# Patient Record
Sex: Male | Born: 1960 | Race: Black or African American | Hispanic: No | Smoking: Never smoker
Health system: Southern US, Community
[De-identification: ages and names within clinical notes are randomized; demographics above are authoritative.]

## PROBLEM LIST (undated history)

## (undated) DIAGNOSIS — I10 Essential (primary) hypertension: Secondary | ICD-10-CM

## (undated) HISTORY — PX: ORTHOPEDIC SURGERY: SHX850

---

## 2000-05-22 ENCOUNTER — Encounter: Payer: Self-pay | Admitting: Emergency Medicine

## 2000-05-22 ENCOUNTER — Emergency Department (HOSPITAL_COMMUNITY): Admission: EM | Admit: 2000-05-22 | Discharge: 2000-05-22 | Payer: Self-pay | Admitting: Emergency Medicine

## 2000-06-05 ENCOUNTER — Encounter: Payer: Self-pay | Admitting: Preventative Medicine

## 2000-06-05 ENCOUNTER — Ambulatory Visit (HOSPITAL_COMMUNITY): Admission: RE | Admit: 2000-06-05 | Discharge: 2000-06-05 | Payer: Self-pay | Admitting: Preventative Medicine

## 2003-10-11 ENCOUNTER — Emergency Department (HOSPITAL_COMMUNITY): Admission: EM | Admit: 2003-10-11 | Discharge: 2003-10-12 | Payer: Self-pay | Admitting: Emergency Medicine

## 2004-01-06 ENCOUNTER — Emergency Department (HOSPITAL_COMMUNITY): Admission: EM | Admit: 2004-01-06 | Discharge: 2004-01-06 | Payer: Self-pay | Admitting: Emergency Medicine

## 2004-07-22 ENCOUNTER — Emergency Department (HOSPITAL_COMMUNITY): Admission: EM | Admit: 2004-07-22 | Discharge: 2004-07-22 | Payer: Self-pay | Admitting: Emergency Medicine

## 2004-12-29 ENCOUNTER — Emergency Department (HOSPITAL_COMMUNITY): Admission: EM | Admit: 2004-12-29 | Discharge: 2004-12-29 | Payer: Self-pay | Admitting: Emergency Medicine

## 2005-01-18 ENCOUNTER — Emergency Department (HOSPITAL_COMMUNITY): Admission: EM | Admit: 2005-01-18 | Discharge: 2005-01-18 | Payer: Self-pay | Admitting: Emergency Medicine

## 2005-06-03 ENCOUNTER — Emergency Department (HOSPITAL_COMMUNITY): Admission: EM | Admit: 2005-06-03 | Discharge: 2005-06-03 | Payer: Self-pay | Admitting: Emergency Medicine

## 2005-06-18 ENCOUNTER — Emergency Department (HOSPITAL_COMMUNITY): Admission: EM | Admit: 2005-06-18 | Discharge: 2005-06-18 | Payer: Self-pay | Admitting: Emergency Medicine

## 2007-05-26 ENCOUNTER — Emergency Department (HOSPITAL_COMMUNITY): Admission: EM | Admit: 2007-05-26 | Discharge: 2007-05-26 | Payer: Self-pay | Admitting: Emergency Medicine

## 2008-10-21 ENCOUNTER — Emergency Department (HOSPITAL_COMMUNITY): Admission: EM | Admit: 2008-10-21 | Discharge: 2008-10-21 | Payer: Self-pay | Admitting: Emergency Medicine

## 2009-05-02 ENCOUNTER — Ambulatory Visit: Payer: Self-pay | Admitting: Family Medicine

## 2009-05-02 DIAGNOSIS — R5383 Other fatigue: Secondary | ICD-10-CM

## 2009-05-02 DIAGNOSIS — R030 Elevated blood-pressure reading, without diagnosis of hypertension: Secondary | ICD-10-CM

## 2009-05-02 DIAGNOSIS — R5381 Other malaise: Secondary | ICD-10-CM | POA: Insufficient documentation

## 2009-05-06 ENCOUNTER — Encounter: Payer: Self-pay | Admitting: Physician Assistant

## 2009-05-06 LAB — CONVERTED CEMR LAB
ALT: 42 units/L (ref 0–53)
AST: 22 units/L (ref 0–37)
Albumin: 4.4 g/dL (ref 3.5–5.2)
Alkaline Phosphatase: 87 units/L (ref 39–117)
BUN: 10 mg/dL (ref 6–23)
CO2: 22 meq/L (ref 19–32)
Chloride: 105 meq/L (ref 96–112)
Creatinine, Ser: 1.01 mg/dL (ref 0.40–1.50)
HCT: 41.5 % (ref 39.0–52.0)
MCHC: 31.6 g/dL (ref 30.0–36.0)
MCV: 92 fL (ref 78.0–100.0)
Platelets: 327 10*3/uL (ref 150–400)
RBC: 4.51 M/uL (ref 4.22–5.81)
RDW: 13.3 % (ref 11.5–15.5)
Sodium: 138 meq/L (ref 135–145)
TSH: 2.739 microintl units/mL (ref 0.350–4.500)
Total Protein: 7 g/dL (ref 6.0–8.3)
VLDL: 25 mg/dL (ref 0–40)
Vit D, 25-Hydroxy: 14 ng/mL — ABNORMAL LOW (ref 30–89)
WBC: 5.6 10*3/uL (ref 4.0–10.5)

## 2009-06-20 ENCOUNTER — Emergency Department (HOSPITAL_COMMUNITY): Admission: EM | Admit: 2009-06-20 | Discharge: 2009-06-20 | Payer: Self-pay | Admitting: Emergency Medicine

## 2009-08-19 ENCOUNTER — Emergency Department (HOSPITAL_COMMUNITY): Admission: EM | Admit: 2009-08-19 | Discharge: 2009-08-20 | Payer: Self-pay | Admitting: Emergency Medicine

## 2010-02-24 NOTE — Letter (Signed)
Summary: Letter  Letter   Imported By: Lind Guest 05/07/2009 15:39:09  _____________________________________________________________________  External Attachment:    Type:   Image     Comment:   External Document
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
## 2010-04-11 LAB — CBC
Hemoglobin: 12.1 g/dL — ABNORMAL LOW (ref 13.0–17.0)
MCH: 29.6 pg (ref 26.0–34.0)
MCHC: 33.5 g/dL (ref 30.0–36.0)
MCV: 88.3 fL (ref 78.0–100.0)
RDW: 13.8 % (ref 11.5–15.5)

## 2010-04-11 LAB — BASIC METABOLIC PANEL
BUN: 14 mg/dL (ref 6–23)
GFR calc Af Amer: >60 mL/min (ref >60)
Potassium: 3.9 mEq/L (ref 3.5–5.1)
Sodium: 139 mEq/L (ref 135–145)

## 2010-04-11 LAB — POCT CARDIAC MARKERS
Myoglobin, poc: 52.6 ng/mL (ref 12–200)
Troponin i, poc: <0.05 ng/mL (ref 0.00–0.09)

## 2010-04-11 LAB — DIFFERENTIAL
Basophils Absolute: 0 10*3/uL (ref 0.0–0.1)
Basophils Relative: 1 % (ref 0–1)
Eosinophils Absolute: 0.4 10*3/uL (ref 0.0–0.7)
Eosinophils Relative: 7 % — ABNORMAL HIGH (ref 0–5)
Lymphocytes Relative: 42 % (ref 12–46)
Lymphs Abs: 2.5 10*3/uL (ref 0.7–4.0)
Monocytes Absolute: 0.6 10*3/uL (ref 0.1–1.0)
Neutrophils Relative %: 40 % — ABNORMAL LOW (ref 43–77)

## 2010-04-11 LAB — D-DIMER, QUANTITATIVE: D-Dimer, Quant: 0.43 ug/mL-FEU (ref 0.00–0.48)

## 2010-12-02 ENCOUNTER — Emergency Department (HOSPITAL_COMMUNITY)
Admission: EM | Admit: 2010-12-02 | Discharge: 2010-12-03 | Disposition: A | Discharge status: Home / Self Care | Payer: BC Managed Care – PPO | Attending: Emergency Medicine | Admitting: Emergency Medicine

## 2010-12-02 ENCOUNTER — Encounter: Payer: Self-pay | Admitting: Emergency Medicine

## 2010-12-02 DIAGNOSIS — K2970 Gastritis, unspecified, without bleeding: Secondary | ICD-10-CM

## 2010-12-02 DIAGNOSIS — R109 Unspecified abdominal pain: Secondary | ICD-10-CM | POA: Insufficient documentation

## 2010-12-02 NOTE — ED Notes (Signed)
Patient c/o sharp abdominal pain all over; also c/o epigastric pain.  Patient states pain did not move and does not radiate.  States has had some nausea.

## 2010-12-03 ENCOUNTER — Emergency Department (HOSPITAL_COMMUNITY): Payer: BC Managed Care – PPO

## 2010-12-03 LAB — DIFFERENTIAL
Basophils Relative: 1 % (ref 0–1)
Lymphocytes Relative: 36 % (ref 12–46)
Monocytes Absolute: 0.4 10*3/uL (ref 0.1–1.0)
Monocytes Relative: 6 % (ref 3–12)
Neutro Abs: 3.5 10*3/uL (ref 1.7–7.7)
Neutrophils Relative %: 53 % (ref 43–77)

## 2010-12-03 LAB — OCCULT BLOOD, POC DEVICE: Fecal Occult Bld: NEGATIVE

## 2010-12-03 LAB — URINALYSIS, ROUTINE W REFLEX MICROSCOPIC
Glucose, UA: NEGATIVE mg/dL
Nitrite: NEGATIVE
Specific Gravity, Urine: 1.025 (ref 1.005–1.030)
Urobilinogen, UA: 0.2 mg/dL (ref 0.0–1.0)
pH: 6 (ref 5.0–8.0)

## 2010-12-03 LAB — COMPREHENSIVE METABOLIC PANEL
AST: 23 U/L (ref 0–37)
Alkaline Phosphatase: 80 U/L (ref 39–117)
BUN: 13 mg/dL (ref 6–23)

## 2010-12-03 LAB — CBC
HCT: 38.4 % — ABNORMAL LOW (ref 39.0–52.0)
Hemoglobin: 12.9 g/dL — ABNORMAL LOW (ref 13.0–17.0)
MCHC: 33.6 g/dL (ref 30.0–36.0)
MCV: 89.9 fL (ref 78.0–100.0)

## 2010-12-03 LAB — LIPASE, BLOOD: Lipase: 16 U/L (ref 11–59)

## 2010-12-03 LAB — POCT I-STAT TROPONIN I: Troponin i, poc: 0 ng/mL (ref 0.00–0.08)

## 2010-12-03 MED ORDER — ONDANSETRON HCL 4 MG/2ML IJ SOLN
4.0000 mg | Freq: Once | INTRAMUSCULAR | Status: AC
  Administered 2010-12-03: 4 mg via INTRAVENOUS
  Filled 2010-12-03: qty 2

## 2010-12-03 MED ORDER — FAMOTIDINE 20 MG PO TABS: 20.0000 mg | ORAL_TABLET | Freq: Two times a day (BID) | ORAL | Status: DC

## 2010-12-03 MED ORDER — FAMOTIDINE IN NACL 20-0.9 MG/50ML-% IV SOLN
20.0000 mg | Freq: Once | INTRAVENOUS | Status: AC
  Administered 2010-12-03: 20 mg via INTRAVENOUS
  Filled 2010-12-03: qty 50

## 2010-12-03 MED ORDER — HYDROCODONE-ACETAMINOPHEN 5-500 MG PO TABS: 1.0000 | ORAL_TABLET | Freq: Four times a day (QID) | ORAL | Status: AC | PRN

## 2010-12-03 MED ORDER — SODIUM CHLORIDE 0.9 % IV SOLN
Freq: Once | INTRAVENOUS | Status: AC
  Administered 2010-12-03: via INTRAVENOUS

## 2010-12-03 MED ORDER — MORPHINE SULFATE 4 MG/ML IJ SOLN
4.0000 mg | Freq: Once | INTRAMUSCULAR | Status: AC
  Administered 2010-12-03: 4 mg via INTRAVENOUS
  Filled 2010-12-03: qty 1

## 2010-12-03 MED ORDER — OMEPRAZOLE 20 MG PO CPDR: 20.0000 mg | DELAYED_RELEASE_CAPSULE | Freq: Every day | ORAL | Status: DC

## 2010-12-03 NOTE — ED Notes (Signed)
Tyler Chang notifeied of low bp

## 2010-12-03 NOTE — ED Notes (Signed)
Dr Brooke Dare at side to place central line

## 2010-12-03 NOTE — ED Provider Notes (Signed)
History     CSN: [id number] Arrival date & time: 12/02/2010 11:30 PM   First MD Initiated Contact with Patient 12/02/10 2343      Chief Complaint  Patient presents with  . Abdominal Pain    (Consider location/radiation/quality/duration/timing/severity/associated sxs/prior treatment) Patient is a 50 y.o. male presenting with abdominal pain. The history is provided by the patient.  Abdominal Pain The primary symptoms of the illness include abdominal pain, nausea and hematochezia. The primary symptoms of the illness do not include fever, fatigue, shortness of breath, vomiting, diarrhea or dysuria. The current episode started yesterday. The onset of the illness was gradual. The problem has been gradually worsening.  The abdominal pain began yesterday. The pain came on gradually. The abdominal pain has been gradually worsening since its onset. The abdominal pain is generalized. The abdominal pain does not radiate. The abdominal pain is relieved by nothing. The abdominal pain is exacerbated by movement and certain positions.  The patient has not had a change in bowel habit. Symptoms associated with the illness do not include chills, anorexia, constipation, urgency, hematuria, frequency or back pain.    Past Medical History  Diagnosis Date  . Asthma     Past Surgical History  Procedure Date  . Orthopedic surgery     No family history on file.  History  Substance Use Topics  . Smoking status: Never Smoker   . Smokeless tobacco: Never Used  . Alcohol Use: Yes     occ      Review of Systems  Constitutional: Negative for fever, chills, activity change, appetite change and fatigue.  HENT: Negative for congestion, sore throat, rhinorrhea, neck pain and neck stiffness.   Respiratory: Negative for cough and shortness of breath.   Cardiovascular: Negative for chest pain and palpitations.  Gastrointestinal: Positive for nausea, abdominal pain and hematochezia. Negative for vomiting,  diarrhea, constipation and anorexia.  Genitourinary: Negative for dysuria, urgency, frequency, hematuria and flank pain.  Musculoskeletal: Negative for back pain.  Neurological: Negative for dizziness, weakness, light-headedness and headaches.  All other systems reviewed and are negative.    Allergies  Review of patient's allergies indicates no known allergies.  Home Medications   Current Outpatient Rx  Name Route Sig Dispense Refill  . FAMOTIDINE 20 MG PO TABS Oral Take 1 tablet (20 mg total) by mouth 2 (two) times daily. 30 tablet 0  . HYDROCODONE-ACETAMINOPHEN 5-500 MG PO TABS Oral Take 1-2 tablets by mouth every 6 (six) hours as needed for pain. 10 tablet 0  . OMEPRAZOLE 20 MG PO CPDR Oral Take 1 capsule (20 mg total) by mouth daily. 30 capsule 0    BP 88/58  Pulse 116  Temp(Src) 98.3 F (36.8 C) (Oral)  Resp 20  Ht 5\' 4"  (1.626 m)  Wt 140 lb (63.504 kg)  BMI 24.03 kg/m2  SpO2 100%  Physical Exam  Nursing note and vitals reviewed. Constitutional: He is oriented to person, place, and time. He appears well-developed and well-nourished. No distress.  HENT:  Head: Normocephalic and atraumatic.  Mouth/Throat: Oropharynx is clear and moist. No oropharyngeal exudate.  Eyes: Conjunctivae and EOM are normal. Pupils are equal, round, and reactive to light.  Neck: Normal range of motion. Neck supple.  Cardiovascular: Normal rate, regular rhythm, normal heart sounds and intact distal pulses.   Pulmonary/Chest: Effort normal and breath sounds normal. No respiratory distress.  Abdominal: Soft. Bowel sounds are normal. There is tenderness (epigastric). There is no rebound and no guarding.  Genitourinary: Rectum  normal. Guaiac negative stool.  Musculoskeletal: Normal range of motion. He exhibits no tenderness.  Neurological: He is alert and oriented to person, place, and time.  Skin: Skin is warm and dry. No rash noted.    ED Course  Procedures (including critical care  time)  Labs Reviewed  CBC - Abnormal; Notable for the following:    Hemoglobin 12.9 (*)    HCT 38.4 (*)    All other components within normal limits  COMPREHENSIVE METABOLIC PANEL - Abnormal; Notable for the following:    GFR calc non Af Amer 81 (*)    All other components within normal limits  DIFFERENTIAL  LIPASE, BLOOD  URINALYSIS, ROUTINE W REFLEX MICROSCOPIC  POCT I-STAT TROPONIN I   Dg Abd Acute W/chest  12/03/2010  *RADIOLOGY REPORT*  Clinical Data: Sharp abdominal pain.  ACUTE ABDOMEN SERIES (ABDOMEN 2 VIEW & CHEST 1 VIEW)  Comparison: 08/19/2009  Findings:  Calcified 7 mm right lower lung nodule is unchanged. No focal consolidation.  Cardiomediastinal contours are within normal limits.  Mild eventration right hemidiaphragm.  Nonobstructive bowel gas pattern.  No free intraperitoneal air. Organ outlines normal where seen.  No acute osseous abnormality. Heterotopic osseous changes of the right hip with partially imaged intramedullary rod and intertrochanteric screw.  There is mild lucency around the hardware, which is nonspecific but could be evaluated with dedicated imaging if clinically warranted.  IMPRESSION: Nonobstructive bowel gas pattern.  Original Report Authenticated By: Waneta Martins, M.D.     1. Gastritis       MDM  Acute abdominal series and blood work was unremarkable. No symptoms are consistent with gastritis. He was treated with IV fluids, pain medication, antiemetics, Pepcid. He had improvement of his symptoms. Rectal examination was negative for blood. He was discharged home with H2-blocker, PPI, pain medication and instructions to followup with primary care physician. I'm not concerned about a surgical cause of his abdominal pain or more malignant cause of his abdominal pain.        [name], MD 12/03/10 [phone]

## 2012-05-24 ENCOUNTER — Emergency Department (HOSPITAL_COMMUNITY)
Admission: EM | Admit: 2012-05-24 | Discharge: 2012-05-24 | Disposition: A | Discharge status: Home / Self Care | Payer: BC Managed Care – PPO | Attending: Emergency Medicine | Admitting: Emergency Medicine

## 2012-05-24 ENCOUNTER — Encounter (HOSPITAL_COMMUNITY): Payer: Self-pay | Admitting: *Deleted

## 2012-05-24 DIAGNOSIS — R55 Syncope and collapse: Secondary | ICD-10-CM | POA: Insufficient documentation

## 2012-05-24 DIAGNOSIS — R42 Dizziness and giddiness: Secondary | ICD-10-CM | POA: Insufficient documentation

## 2012-05-24 DIAGNOSIS — R0602 Shortness of breath: Secondary | ICD-10-CM | POA: Insufficient documentation

## 2012-05-24 DIAGNOSIS — J45909 Unspecified asthma, uncomplicated: Secondary | ICD-10-CM | POA: Insufficient documentation

## 2012-05-24 DIAGNOSIS — R252 Cramp and spasm: Secondary | ICD-10-CM | POA: Insufficient documentation

## 2012-05-24 DIAGNOSIS — I959 Hypotension, unspecified: Secondary | ICD-10-CM | POA: Insufficient documentation

## 2012-05-24 LAB — BASIC METABOLIC PANEL
BUN: 15 mg/dL (ref 6–23)
CO2: 25 mEq/L (ref 19–32)
Calcium: 9.2 mg/dL (ref 8.4–10.5)
Creatinine, Ser: 0.96 mg/dL (ref 0.50–1.35)
Potassium: 3.8 mEq/L (ref 3.5–5.1)
Sodium: 136 mEq/L (ref 135–145)

## 2012-05-24 LAB — CBC WITH DIFFERENTIAL/PLATELET
HCT: 34.1 % — ABNORMAL LOW (ref 39.0–52.0)
Hemoglobin: 11.7 g/dL — ABNORMAL LOW (ref 13.0–17.0)
Lymphocytes Relative: 28 % (ref 12–46)
Lymphs Abs: 1.7 10*3/uL (ref 0.7–4.0)
MCH: 29.5 pg (ref 26.0–34.0)
MCHC: 34.3 g/dL (ref 30.0–36.0)
Monocytes Absolute: 0.6 10*3/uL (ref 0.1–1.0)
Monocytes Relative: 10 % (ref 3–12)
Neutrophils Relative %: 60 % (ref 43–77)
Platelets: 221 10*3/uL (ref 150–400)
WBC: 6.3 10*3/uL (ref 4.0–10.5)

## 2012-05-24 NOTE — ED Provider Notes (Signed)
History  This chart was scribed for Donnetta Hutching, MD by Bennett Scrape, ED Scribe. This patient was seen in room [facility]06/[facility]06 and the patient's care was started at 5:09 PM.  CSN: [id number]  Arrival date & time 05/24/12  1708   First MD Initiated Contact with Patient 05/24/12 1709      Chief Complaint  Patient presents with  . Hypotension  . Loss of Consciousness     The history is provided by the patient. No language interpreter was used.    HPI Comments: Tyler Chang is a 52 y.o. male brought in by ambulance, who presents to the Emergency Department complaining of one episode of LOC for approximately 20 minutes. Per EMS, pt got home from work at 11 am this morning after working 12 hours in a hot environment, which is routine for him, and slept for a couple of hours. Upon waking around 1 PM, he had a cramp in his left thigh and became hot, lightheaded and SOB. Per daughter, pt's eyes "rolled in back of his head" for about 20 minutes. Daughter denies seizure like activity and denies confusion after the episode stating that the pt said "I knew what was going on". Pt admits to one prior episode at work "when I got too hot". EMS reported hypotension upon arrival with a BP of 94/60 and a CBG of 78. EMS gave 500 mL of NS bolus and pressure upon arrival to the ED was 113/74. He denies CP, abdominal pain and nausea as associated symptoms. He has a h/o asthma and denies smoking and alcohol use.  Pt denies having a PCP currently  Past Medical History  Diagnosis Date  . Asthma     Past Surgical History  Procedure Laterality Date  . Orthopedic surgery      No family history on file.  History  Substance Use Topics  . Smoking status: Never Smoker   . Smokeless tobacco: Never Used  . Alcohol Use: No      Review of Systems  A complete 10 system review of systems was obtained and all systems are negative except as noted in the HPI and PMH.   Allergies  Review of patient's allergies  indicates no known allergies.  Home Medications   Current Outpatient Rx  Name  Route  Sig  Dispense  Refill  . EXPIRED: famotidine (PEPCID) 20 MG tablet   Oral   Take 1 tablet (20 mg total) by mouth 2 (two) times daily.   30 tablet   0   . EXPIRED: omeprazole (PRILOSEC) 20 MG capsule   Oral   Take 1 capsule (20 mg total) by mouth daily.   30 capsule   0     Triage Vitals: BP 106/67  Temp(Src) 97.8 F (36.6 C) (Oral)  Resp 16  Ht 5\' 4"  (1.626 m)  Wt 140 lb (63.504 kg)  BMI 24.02 kg/m2  SpO2 97%  Physical Exam  Nursing note and vitals reviewed. Constitutional: He is oriented to person, place, and time. He appears well-developed and well-nourished.  HENT:  Head: Normocephalic and atraumatic.  Eyes: Conjunctivae and EOM are normal. Pupils are equal, round, and reactive to light.  Neck: Normal range of motion. Neck supple.  Cardiovascular: Normal rate, regular rhythm and normal heart sounds.   Pulmonary/Chest: Effort normal and breath sounds normal.  Abdominal: Soft. Bowel sounds are normal.  Musculoskeletal: Normal range of motion.  Neurological: He is alert and oriented to person, place, and time.  Skin: Skin is  warm and dry.  Psychiatric: He has a normal mood and affect.    ED Course  Procedures (including critical care time)  DIAGNOSTIC STUDIES: Oxygen Saturation is 97% on room air, normal by my interpretation.    COORDINATION OF CARE: 5:47 PM-Advised pt that he is well appearing with stable vitals signs and informed pt that I believe his symptoms are most likely pain related LOC. Discussed treatment plan which includes CXR, CBC panel, CMP and UA with pt at bedside and pt agreed to plan. Pt is requesting a work note for [employer].   Labs Reviewed  CBC WITH DIFFERENTIAL - Abnormal; Notable for the following:    RBC 3.96 (*)    Hemoglobin 11.7 (*)    HCT 34.1 (*)    All other components within normal limits  BASIC METABOLIC PANEL   No results found.   No  diagnosis found.   Date: 05/24/2012  Rate: 64  Rhythm: normal sinus rhythm  QRS Axis: normal  Intervals: normal  ST/T Wave abnormalities: normal  Conduction Disutrbances: none  Narrative Interpretation: unremarkable      MDM  No neurological deficits.  Physical exam completely normal. Suspect syncopal spell precipitating by cramp in his left leg.  Patient is slightly anemic. EKG normal.      I personally performed the services described in this documentation, which was scribed in my presence. The recorded information has been reviewed and is accurate.    Donnetta Hutching, MD 05/24/12 [phone]

## 2012-05-24 NOTE — ED Notes (Signed)
Per EMS - pt got home from work approx 11 am this morning - states works in hot environment - slept for a couple of hours, woke up and had a cramp in his leg, became hot with sob and wife states pt's eyes "rolled in back of his head."  Denies hitting head.  Unknown LOC time.  EMS reports hypotension upon arrival with bp 94/60, cbg 78.  EMS gave NS bolus of , pressure upon arrival 113/74.  Pt alert and oriented x 4 at this time.  Denies pain.

## 2013-08-20 ENCOUNTER — Emergency Department (HOSPITAL_COMMUNITY)
Admission: EM | Admit: 2013-08-20 | Discharge: 2013-08-20 | Disposition: A | Discharge status: Home / Self Care | Payer: BC Managed Care – PPO | Attending: Emergency Medicine | Admitting: Emergency Medicine

## 2013-08-20 ENCOUNTER — Encounter (HOSPITAL_COMMUNITY): Payer: Self-pay | Admitting: Emergency Medicine

## 2013-08-20 DIAGNOSIS — Y9389 Activity, other specified: Secondary | ICD-10-CM | POA: Insufficient documentation

## 2013-08-20 DIAGNOSIS — J45909 Unspecified asthma, uncomplicated: Secondary | ICD-10-CM | POA: Insufficient documentation

## 2013-08-20 DIAGNOSIS — T6391XA Toxic effect of contact with unspecified venomous animal, accidental (unintentional), initial encounter: Secondary | ICD-10-CM | POA: Insufficient documentation

## 2013-08-20 DIAGNOSIS — T63441A Toxic effect of venom of bees, accidental (unintentional), initial encounter: Secondary | ICD-10-CM

## 2013-08-20 DIAGNOSIS — T63461A Toxic effect of venom of wasps, accidental (unintentional), initial encounter: Secondary | ICD-10-CM | POA: Insufficient documentation

## 2013-08-20 DIAGNOSIS — Y92009 Unspecified place in unspecified non-institutional (private) residence as the place of occurrence of the external cause: Secondary | ICD-10-CM | POA: Insufficient documentation

## 2013-08-20 MED ORDER — DIPHENHYDRAMINE HCL 25 MG PO TABS: 25.0000 mg | ORAL_TABLET | ORAL | Status: DC | PRN

## 2013-08-20 MED ORDER — IBUPROFEN 800 MG PO TABS: 800.0000 mg | ORAL_TABLET | Freq: Three times a day (TID) | ORAL | Status: DC

## 2013-08-20 MED ORDER — DIPHENHYDRAMINE HCL 25 MG PO CAPS
50.0000 mg | ORAL_CAPSULE | Freq: Once | ORAL | Status: AC
  Administered 2013-08-20: 50 mg via ORAL
  Filled 2013-08-20: qty 2

## 2013-08-20 MED ORDER — HYDROCORTISONE 1 % EX CREA: TOPICAL_CREAM | CUTANEOUS | Status: DC

## 2013-08-20 MED ORDER — IBUPROFEN 800 MG PO TABS
800.0000 mg | ORAL_TABLET | Freq: Once | ORAL | Status: AC
  Administered 2013-08-20: 800 mg via ORAL
  Filled 2013-08-20: qty 1

## 2013-08-20 NOTE — ED Provider Notes (Signed)
CSN: [id number]     Arrival date & time 08/20/13  2111 History   First MD Initiated Contact with Patient 08/20/13 2117     Chief Complaint  Patient presents with  . Insect Bite   IVOR KISHI is a 53 y.o. male who presents to the Emergency Department complaining of multiple bee stings.  States he was mowing his yard and was stung to the right lower leg, back of his head and left index finger.  He denies difficulty breathing or swallowing, swelling, LOC, redness or chest pain.  No shortness of breath.  Incident occurred approx 1-2 hrs prior to ED arrival.  He has not taken any medications or applied ice.  No hx of severe allergic rxn to bees.   (Consider location/radiation/quality/duration/timing/severity/associated sxs/prior Treatment) The history is provided by the patient.    Past Medical History  Diagnosis Date  . Asthma    Past Surgical History  Procedure Laterality Date  . Orthopedic surgery     No family history on file. History  Substance Use Topics  . Smoking status: Never Smoker   . Smokeless tobacco: Never Used  . Alcohol Use: No    Review of Systems  Constitutional: Negative for fever, chills, activity change and appetite change.  HENT: Negative for facial swelling, sore throat, trouble swallowing and voice change.   Respiratory: Negative for chest tightness, shortness of breath and wheezing.   Musculoskeletal: Negative for neck pain and neck stiffness.  Skin: Negative for color change, rash and wound.       Bee stings  Neurological: Negative for dizziness, weakness, numbness and headaches.  All other systems reviewed and are negative.     Allergies  Review of patient's allergies indicates no known allergies.  Home Medications   Prior to Admission medications   Not on File   BP 114/75  Pulse 79  Temp(Src) 98 F (36.7 C) (Oral)  Resp 20  Ht 5\' 4"  (1.626 m)  Wt 152 lb (68.947 kg)  BMI 26.08 kg/m2  SpO2 100% Physical Exam  Nursing note and vitals  reviewed. Constitutional: He is oriented to person, place, and time. He appears well-developed and well-nourished. No distress.  HENT:  Head: Normocephalic and atraumatic.  Mouth/Throat: Uvula is midline, oropharynx is clear and moist and mucous membranes are normal. No oral lesions. No trismus in the jaw. No uvula swelling.  Neck: Normal range of motion. Neck supple.  Cardiovascular: Normal rate, regular rhythm, normal heart sounds and intact distal pulses.   No murmur heard. Pulmonary/Chest: Effort normal and breath sounds normal. No respiratory distress. He has no wheezes.  Musculoskeletal: Normal range of motion. He exhibits no edema and no tenderness.  Lymphadenopathy:    He has no cervical adenopathy.  Neurological: He is alert and oriented to person, place, and time. He exhibits normal muscle tone. Coordination normal.  Skin: Skin is warm.  Erythematous papules to the right lower leg, left index finger and mild dime sized STS to the posterior scalp.  No hives or facial edema.      ED Course  Procedures (including critical care time) Labs Review Labs Reviewed - No data to display  Imaging Review No results found.   EKG Interpretation None      MDM   Final diagnoses:  Bee sting reaction, accidental or unintentional, initial encounter   Pt is feeling better after medication and ice.  Airway remains patent.  No concerning sx's for anaphylaxis.  Appears stable for d/c.  rx's  for ibuprofen, benadryl  And hydrocortisone cream.      Patient is well appearing.  Airway remains patent.  No edema.  VSS.  He has been observed in the dept w/o complications.    Tonimarie Gritz L. Trisha Mangleriplett, PA-C 08/22/13 1720

## 2013-08-20 NOTE — ED Notes (Signed)
Pt c/o multiple bee stings, but denies being allergic.

## 2013-08-20 NOTE — Discharge Instructions (Signed)

## 2013-08-22 NOTE — ED Provider Notes (Signed)
Medical screening examination/treatment/procedure(s) were performed by non-physician practitioner and as supervising physician I was immediately available for consultation/collaboration.   EKG Interpretation None        Luan Urbani, MD 08/22/13 1910 

## 2013-09-02 ENCOUNTER — Emergency Department (HOSPITAL_COMMUNITY): Payer: BC Managed Care – PPO

## 2013-09-02 ENCOUNTER — Encounter (HOSPITAL_COMMUNITY): Payer: Self-pay | Admitting: Emergency Medicine

## 2013-09-02 ENCOUNTER — Emergency Department (HOSPITAL_COMMUNITY)
Admission: EM | Admit: 2013-09-02 | Discharge: 2013-09-02 | Disposition: A | Discharge status: Home / Self Care | Payer: BC Managed Care – PPO | Attending: Emergency Medicine | Admitting: Emergency Medicine

## 2013-09-02 DIAGNOSIS — J45901 Unspecified asthma with (acute) exacerbation: Secondary | ICD-10-CM

## 2013-09-02 DIAGNOSIS — R0602 Shortness of breath: Secondary | ICD-10-CM | POA: Insufficient documentation

## 2013-09-02 DIAGNOSIS — R0789 Other chest pain: Secondary | ICD-10-CM | POA: Insufficient documentation

## 2013-09-02 DIAGNOSIS — IMO0002 Reserved for concepts with insufficient information to code with codable children: Secondary | ICD-10-CM | POA: Insufficient documentation

## 2013-09-02 MED ORDER — ALBUTEROL SULFATE (2.5 MG/3ML) 0.083% IN NEBU
2.5000 mg | INHALATION_SOLUTION | Freq: Once | RESPIRATORY_TRACT | Status: AC
  Administered 2013-09-02: 2.5 mg via RESPIRATORY_TRACT
  Filled 2013-09-02: qty 3

## 2013-09-02 MED ORDER — PREDNISONE 10 MG PO TABS
60.0000 mg | ORAL_TABLET | Freq: Once | ORAL | Status: AC
  Administered 2013-09-02: 60 mg via ORAL
  Filled 2013-09-02 (×2): qty 1

## 2013-09-02 MED ORDER — IPRATROPIUM-ALBUTEROL 0.5-2.5 (3) MG/3ML IN SOLN
3.0000 mL | RESPIRATORY_TRACT | Status: DC
  Administered 2013-09-02: 3 mL via RESPIRATORY_TRACT
  Filled 2013-09-02: qty 3

## 2013-09-02 MED ORDER — PREDNISONE 20 MG PO TABS: 40.0000 mg | ORAL_TABLET | Freq: Every day | ORAL | Status: DC

## 2013-09-02 MED ORDER — ALBUTEROL SULFATE HFA 108 (90 BASE) MCG/ACT IN AERS
2.0000 | INHALATION_SPRAY | Freq: Once | RESPIRATORY_TRACT | Status: AC
  Administered 2013-09-02: 2 via RESPIRATORY_TRACT
  Filled 2013-09-02: qty 6.7

## 2013-09-02 NOTE — ED Notes (Signed)
Pt states he has had difficulty breathing since early this morning. Pt has a history of asthma but does not use an inhaler. Pt with diminished breath sounds bilaterally.

## 2013-09-02 NOTE — ED Notes (Signed)
Pt SpO2 while ambulating 95-99%. Pt reported "feel better after the breathing treatment."

## 2013-09-02 NOTE — Discharge Instructions (Signed)

## 2013-09-03 NOTE — ED Provider Notes (Signed)
CSN: [id number]     Arrival date & time 09/02/13  2000 History   First MD Initiated Contact with Patient 09/02/13 2027     Chief Complaint  Patient presents with  . Shortness of Breath     (Consider location/radiation/quality/duration/timing/severity/associated sxs/prior Treatment)  Tyler Chang is a 53 y.o. male with hx of asthma, presents to the Emergency Department complaining of gradual onset of chest tightness and shortness of breath that began several hours ago after cleaning house.  He states that he was helping someone sweep out a dusty area and developed difficulty "catching my breath".  States he has not used an inhaler in a long time.  He denies chest pain, fever, cough, hemoptysis, or any symptoms prior to working inside the house.     Patient is a 53 y.o. male presenting with shortness of breath. The history is provided by the patient.  Shortness of Breath Severity:  Mild Onset quality:  Gradual Duration:  10 hours Timing:  Constant Progression:  Unchanged Chronicity:  New Context: pollens and strong odors   Relieved by:  Nothing Worsened by:  Nothing tried Ineffective treatments:  None tried Associated symptoms: no abdominal pain, no chest pain, no cough, no fever, no headaches, no hemoptysis, no neck pain, no PND, no rash, no sore throat, no sputum production, no vomiting and no wheezing   Associated symptoms comment:  "chest tightness" Risk factors: no hx of PE/DVT     Past Medical History  Diagnosis Date  . Asthma    Past Surgical History  Procedure Laterality Date  . Orthopedic surgery     History reviewed. No pertinent family history. History  Substance Use Topics  . Smoking status: Never Smoker   . Smokeless tobacco: Never Used  . Alcohol Use: No    Review of Systems  Constitutional: Negative for fever, chills, activity change and appetite change.  HENT: Negative for congestion, ear discharge, facial swelling, rhinorrhea, sore throat, trouble  swallowing and voice change.   Eyes: Negative for visual disturbance.  Respiratory: Positive for chest tightness and shortness of breath. Negative for cough, hemoptysis, sputum production, wheezing and stridor.   Cardiovascular: Negative for chest pain, leg swelling and PND.  Gastrointestinal: Negative for nausea, vomiting and abdominal pain.  Genitourinary: Negative for flank pain.  Musculoskeletal: Negative for arthralgias, neck pain and neck stiffness.  Skin: Negative.  Negative for rash.  Neurological: Negative for dizziness, weakness, numbness and headaches.  Hematological: Negative for adenopathy.  Psychiatric/Behavioral: Negative for confusion.  All other systems reviewed and are negative.     Allergies  Review of patient's allergies indicates no known allergies.  Home Medications   Prior to Admission medications   Medication Sig Start Date End Date Taking? Authorizing Provider  acetaminophen (PAIN RELIEVER) 325 MG tablet Take 325 mg by mouth once as needed for mild pain or moderate pain.   Yes Historical Provider, MD  predniSONE (DELTASONE) 20 MG tablet Take 2 tablets (40 mg total) by mouth daily. For 5 days 09/02/13   Gennie Eisinger L. Giordana Weinheimer, PA-C   BP 137/87  Pulse 102  Temp(Src) 98.1 F (36.7 C) (Oral)  Resp 24  Ht 5\' 4"  (1.626 m)  Wt 140 lb (63.504 kg)  BMI 24.02 kg/m2  SpO2 95% Physical Exam  Nursing note and vitals reviewed. Constitutional: He is oriented to person, place, and time. He appears well-developed and well-nourished. No distress.  HENT:  Head: Normocephalic and atraumatic.  Right Ear: Tympanic membrane and ear canal normal.  Left Ear: Tympanic membrane and ear canal normal.  Mouth/Throat: Uvula is midline, oropharynx is clear and moist and mucous membranes are normal. No oropharyngeal exudate.  Eyes: EOM are normal. Pupils are equal, round, and reactive to light.  Neck: Normal range of motion, full passive range of motion without pain and phonation normal.  Neck supple.  Cardiovascular: Normal rate, regular rhythm, normal heart sounds and intact distal pulses.   No murmur heard. Pulmonary/Chest: Effort normal. No stridor. No respiratory distress. He has no wheezes. He has no rales. He exhibits no tenderness.  Lung sounds diminished bilaterally.  No rales, wheezing or retractions.    Musculoskeletal: Normal range of motion. He exhibits no edema.  Lymphadenopathy:    He has no cervical adenopathy.  Neurological: He is alert and oriented to person, place, and time. He exhibits normal muscle tone. Coordination normal.  Skin: Skin is warm and dry.    ED Course  Procedures (including critical care time) Labs Review Labs Reviewed - No data to display  Imaging Review Dg Chest 2 View  09/02/2013   CLINICAL DATA:  Shortness of breath with chest tightness. Congestion.  EXAM: CHEST  2 VIEW  COMPARISON:  08/19/2009  FINDINGS: The heart size and mediastinal contours are within normal limits. Both lungs are clear. The visualized skeletal structures are unremarkable. BILATERAL nipple shadows are more prominent than was seen previously. Overall aeration is unchanged.  IMPRESSION: No active cardiopulmonary disease.   Electronically Signed   By: Davonna Belling M.D.   On: 09/02/2013 20:38     EKG Interpretation None      MDM   Final diagnoses:  Asthma exacerbation    Pt is feeling better after neb and prednisone.  States his symptoms have resolved.  Ambulated in the dept with O2 sat 95-99% on RA.  Clinical suspicion for PE is low.  Albuterol MDI dispensed, Rx for prednisone.  Pt agrees to close f/u or to return here if sx's worse.  Appears stable for d/c    Brad Lieurance L. Trisha Mangle, PA-C 09/03/13 1844

## 2013-09-06 NOTE — ED Provider Notes (Signed)
Medical screening examination/treatment/procedure(s) were performed by non-physician practitioner and as supervising physician I was immediately available for consultation/collaboration.   EKG Interpretation None        Vanetta MuldersScott Ayven Glasco, MD 09/06/13 [phone]

## 2013-10-02 ENCOUNTER — Encounter (HOSPITAL_COMMUNITY): Payer: Self-pay | Admitting: Emergency Medicine

## 2013-10-02 ENCOUNTER — Emergency Department (HOSPITAL_COMMUNITY)
Admission: EM | Admit: 2013-10-02 | Discharge: 2013-10-03 | Disposition: A | Discharge status: Home / Self Care | Payer: BC Managed Care – PPO | Attending: Emergency Medicine | Admitting: Emergency Medicine

## 2013-10-02 DIAGNOSIS — I951 Orthostatic hypotension: Secondary | ICD-10-CM

## 2013-10-02 DIAGNOSIS — R55 Syncope and collapse: Secondary | ICD-10-CM | POA: Insufficient documentation

## 2013-10-02 DIAGNOSIS — J45909 Unspecified asthma, uncomplicated: Secondary | ICD-10-CM | POA: Insufficient documentation

## 2013-10-02 DIAGNOSIS — R404 Transient alteration of awareness: Secondary | ICD-10-CM | POA: Insufficient documentation

## 2013-10-02 DIAGNOSIS — R079 Chest pain, unspecified: Secondary | ICD-10-CM | POA: Insufficient documentation

## 2013-10-02 LAB — CBC WITH DIFFERENTIAL/PLATELET
Basophils Absolute: 0 10*3/uL (ref 0.0–0.1)
Basophils Relative: 1 % (ref 0–1)
Eosinophils Absolute: 0.3 10*3/uL (ref 0.0–0.7)
Eosinophils Relative: 4 % (ref 0–5)
HEMATOCRIT: 38.3 % — AB (ref 39.0–52.0)
HEMOGLOBIN: 13.2 g/dL (ref 13.0–17.0)
LYMPHS ABS: 2.9 10*3/uL (ref 0.7–4.0)
LYMPHS PCT: 48 % — AB (ref 12–46)
MCH: 30.9 pg (ref 26.0–34.0)
MCHC: 34.5 g/dL (ref 30.0–36.0)
MCV: 89.7 fL (ref 78.0–100.0)
MONO ABS: 0.5 10*3/uL (ref 0.1–1.0)
MONOS PCT: 7 % (ref 3–12)
NEUTROS ABS: 2.4 10*3/uL (ref 1.7–7.7)
NEUTROS PCT: 40 % — AB (ref 43–77)
Platelets: 264 10*3/uL (ref 150–400)
RBC: 4.27 MIL/uL (ref 4.22–5.81)
RDW: 13.9 % (ref 11.5–15.5)
WBC: 6.1 10*3/uL (ref 4.0–10.5)

## 2013-10-02 MED ORDER — ASPIRIN 81 MG PO CHEW
324.0000 mg | CHEWABLE_TABLET | Freq: Once | ORAL | Status: AC
  Administered 2013-10-03: 324 mg via ORAL
  Filled 2013-10-02: qty 4

## 2013-10-02 NOTE — ED Notes (Signed)
Pt ambulated in to registration, stated that he passed out according to wife, pt states hx of same when dehydrated, pt believes he may be dehydrated again; pt later informs registration that he has CP as well, pt in no respiratory distress

## 2013-10-02 NOTE — ED Provider Notes (Addendum)
CSN: [id number]     Arrival date & time 10/02/13  2157 History   This chart was scribed for Dione Booze, MD by Bronson Curb, ED Scribe. This patient was seen in room APA08/[facility]08 and the patient's care was started at 11:05 PM.      Chief Complaint  Patient presents with  . Chest Pain  . Loss of Consciousness     The history is provided by the patient. No language interpreter was used.    HPI Comments: Tyler Chang is a 53 y.o. male who presents to the Emergency Department complaining of sharp, right sided chest pain that began after syncopal episode approximately 5 hours ago. Patient states he woke up this evening, got out of the bed, and "passed out" upon standing. Per wife, patient was unconscious for 3-4 minutes. Patient rates the CP pain as 5/10. Patient states his symptoms have not resolved, and also reports a burning sensation in his chest he rates as 1-2/10, currently. Patient reports history of LOC with associated CP, but believes this was due to dehydration. Patient denies any prolonged heat exposure today and states he works 3rd shift. He also denies SOB, nausea, vomiting, or diaphoresis .He denies history of DM, HTN, or high cholesterol. Patient is currently not on any medications, and has history of asthma.    Past Medical History  Diagnosis Date  . Asthma    Past Surgical History  Procedure Laterality Date  . Orthopedic surgery     History reviewed. No pertinent family history. History  Substance Use Topics  . Smoking status: Never Smoker   . Smokeless tobacco: Never Used  . Alcohol Use: No    Review of Systems  Constitutional: Negative for fever, chills and diaphoresis.  Respiratory: Negative for shortness of breath.   Cardiovascular: Positive for chest pain.  Gastrointestinal: Negative for nausea and vomiting.  Neurological: Positive for syncope.  All other systems reviewed and are negative.     Allergies  Review of patient's allergies indicates no  known allergies.  Home Medications   Prior to Admission medications   Not on File   Triage Vitals: BP 142/112  Pulse 98  Temp(Src) 97.7 F (36.5 C) (Oral)  Resp 16  Ht  (1.626 m)  Wt 154 lb (69.854 kg)  BMI 26.42 kg/m2  SpO2 98%  Physical Exam  Nursing note and vitals reviewed. Constitutional: He is oriented to person, place, and time. He appears well-developed and well-nourished. No distress.  HENT:  Head: Normocephalic and atraumatic.  Eyes: Conjunctivae and EOM are normal. Pupils are equal, round, and reactive to light.  Neck: Normal range of motion. Neck supple. No JVD present.  Cardiovascular: Normal rate, regular rhythm and normal heart sounds.   No murmur heard. No carotid bruits.  Pulmonary/Chest: Effort normal and breath sounds normal. He has no wheezes. He has no rales.  Abdominal: Soft. Bowel sounds are normal. He exhibits no distension and no mass. There is no tenderness.  Musculoskeletal: Normal range of motion. He exhibits no edema.  Lymphadenopathy:    He has no cervical adenopathy.  Neurological: He is alert and oriented to person, place, and time. He has normal reflexes. No cranial nerve deficit. Coordination normal.  Skin: Skin is warm and dry. No rash noted.  Psychiatric: He has a normal mood and affect. His behavior is normal. Thought content normal.    ED Course  Procedures (including critical care time)  DIAGNOSTIC STUDIES: Oxygen Saturation is 98% on room air, normal  by my interpretation.    COORDINATION OF CARE: At 2309 Discussed treatment plan with patient which includes Aspirin and labs. Patient agrees.   Labs Review Results for orders placed during the hospital encounter of 10/02/13  CBC WITH DIFFERENTIAL      Result Value Ref Range   WBC 6.1  4.0 - 10.5 K/uL   RBC 4.27  4.22 - 5.81 MIL/uL   Hemoglobin 13.2  13.0 - 17.0 g/dL   HCT 40.9 (*) 81.1 - 91.4 %   MCV 89.7  78.0 - 100.0 fL   MCH 30.9  26.0 - 34.0 pg   MCHC 34.5  30.0 -  36.0 g/dL   RDW 78.2  95.6 - 21.3 %   Platelets 264  150 - 400 K/uL   Neutrophils Relative % 40 (*) 43 - 77 %   Neutro Abs 2.4  1.7 - 7.7 K/uL   Lymphocytes Relative 48 (*) 12 - 46 %   Lymphs Abs 2.9  0.7 - 4.0 K/uL   Monocytes Relative 7  3 - 12 %   Monocytes Absolute 0.5  0.1 - 1.0 K/uL   Eosinophils Relative 4  0 - 5 %   Eosinophils Absolute 0.3  0.0 - 0.7 K/uL   Basophils Relative 1  0 - 1 %   Basophils Absolute 0.0  0.0 - 0.1 K/uL  BASIC METABOLIC PANEL      Result Value Ref Range   Sodium 139  137 - 147 mEq/L   Potassium 3.9  3.7 - 5.3 mEq/L   Chloride 103  96 - 112 mEq/L   CO2 25  19 - 32 mEq/L   Glucose, Bld 89  70 - 99 mg/dL   BUN 14  6 - 23 mg/dL   Creatinine, Ser 0.86  0.50 - 1.35 mg/dL   Calcium 9.5  8.4 - 57.8 mg/dL   GFR calc non Af Amer >90  >90 mL/min   GFR calc Af Amer >90  >90 mL/min   Anion gap 11  5 - 15  TROPONIN I      Result Value Ref Range   Troponin I <0.30  <0.30 ng/mL    EKG Interpretation   Date/Time:  Tuesday October 02 2013 22:28:35 EDT Ventricular Rate:  58 PR Interval:  172 QRS Duration: 88 QT Interval:  396 QTC Calculation: 388 R Axis:   64 Text Interpretation:  Sinus bradycardia Otherwise normal ECG No  significant change since last tracing Confirmed by KNAPP  MD-J, JON  ([id number]) on 10/02/2013 10:34:10 PM      MDM   Final diagnoses:  Orthostatic syncope    Syncope which sounds orthostatic on history. Old records are reviewed and he has a prior ED visit for syncope secondary to dehydration. Screening labs obtained and are unremarkable. However, on orthostatic testing, there was a significant rise in heart rate suggesting that he did indeed have it with acetic syncopal episode. He is given IV hydration and feels much better. He is discharged with instructions to make sure to maintain adequate fluid intake.  I personally performed the services described in this documentation, which was scribed in my presence. The recorded information  has been reviewed and is accurate.     Dione Booze, MD 10/03/13 0157  Dione Booze, MD 10/03/13 0200

## 2013-10-02 NOTE — ED Notes (Signed)
Pt states he got up and sat on the edge of his bed and "passed out", when he woke up he felt some "small short sharp pains in" the right side of his chest. States he has had them before a "long time ago" and has problems with his " Nerves".

## 2013-10-03 LAB — BASIC METABOLIC PANEL
Anion gap: 11 (ref 5–15)
BUN: 14 mg/dL (ref 6–23)
CHLORIDE: 103 meq/L (ref 96–112)
CO2: 25 meq/L (ref 19–32)
CREATININE: 0.99 mg/dL (ref 0.50–1.35)
Calcium: 9.5 mg/dL (ref 8.4–10.5)
GFR calc non Af Amer: >90 mL/min (ref >90)
GLUCOSE: 89 mg/dL (ref 70–99)
POTASSIUM: 3.9 meq/L (ref 3.7–5.3)
Sodium: 139 mEq/L (ref 137–147)

## 2013-10-03 LAB — TROPONIN I: Troponin I: <0.3 ng/mL (ref <0.30)

## 2013-10-03 MED ORDER — SODIUM CHLORIDE 0.9 % IV SOLN: 1000.0000 mL | Freq: Once | INTRAVENOUS | Status: DC

## 2013-10-03 MED ORDER — SODIUM CHLORIDE 0.9 % IV SOLN: 1000.0000 mL | INTRAVENOUS | Status: DC

## 2013-10-03 NOTE — Discharge Instructions (Signed)
Make sure to drink plenty of fluids.  Orthostatic Hypotension Orthostatic hypotension is a sudden drop in blood pressure. It happens when you quickly stand up from a seated or lying position. You may feel dizzy or light-headed. This can last for just a few seconds or for up to a few minutes. It is usually not a serious problem. However, if this happens frequently or gets worse, it can be a sign of something more serious. CAUSES  Different things can cause orthostatic hypotension, including:   Loss of body fluids (dehydration).  Medicines that lower blood pressure.  Sudden changes in posture, such as standing up quickly after you have been sitting or lying down.  Taking too much of your medicine. SIGNS AND SYMPTOMS   Light-headedness or dizziness.   Fainting or near-fainting.   A fast heart rate.   Weakness.   Feeling tired (fatigue).  DIAGNOSIS  Your health care provider may do several things to help diagnose your condition and identify the cause. These may include:   Taking a medical history and doing a physical exam.  Checking your blood pressure. Your health care provider will check your blood pressure when you are:  Lying down.  Sitting.  Standing.  Using tilt table testing. In this test, you lie down on a table that moves from a lying position to a standing position. You will be strapped onto the table. This test monitors your blood pressure and heart rate when you are in different positions. TREATMENT  Treatment will vary depending on the cause. Possible treatments include:   Changing the dosage of your medicines.  Wearing compression stockings on your lower legs.  Standing up slowly after sitting or lying down.  Eating more salt.  Eating frequent, small meals.  In some cases, getting IV fluids.  Taking medicine to enhance fluid retention. HOME CARE INSTRUCTIONS  Only take over-the-counter or prescription medicines as directed by your health care  provider.  Follow your health care provider's instructions for changing the dosage of your current medicines.  Do not stop or adjust your medicine on your own.  Stand up slowly after sitting or lying down. This allows your body to adjust to the different position.  Wear compression stockings as directed.  Eat extra salt as directed.  Do not add extra salt to your diet unless directed to by your health care provider.  Eat frequent, small meals.  Avoid standing suddenly after eating.  Avoid hot showers or excessive heat as directed by your health care provider.  Keep all follow-up appointments. SEEK MEDICAL CARE IF:  You continue to feel dizzy or light-headed after standing.  You feel groggy or confused.  You feel cold, clammy, or sick to your stomach (nauseous).  You have blurred vision.  You feel short of breath. SEEK IMMEDIATE MEDICAL CARE IF:   You faint after standing.  You have chest pain.  You have difficulty breathing.   You lose feeling or movement in your arms or legs.   You have slurred speech or difficulty talking, or you are unable to talk.  MAKE SURE YOU:   Understand these instructions.  Will watch your condition.  Will get help right away if you are not doing well or get worse. Document Released: 01/01/2002 Document Revised: 01/16/2013 Document Reviewed: 11/03/2012 [facility] Patient Information 2015 [place], [place]. This information is not intended to replace advice given to you by your health care provider. Make sure you discuss any questions you have with your health care provider.

## 2014-04-24 ENCOUNTER — Emergency Department (HOSPITAL_COMMUNITY)
Admission: EM | Admit: 2014-04-24 | Discharge: 2014-04-24 | Disposition: A | Discharge status: Home / Self Care | Payer: PRIVATE HEALTH INSURANCE | Attending: Emergency Medicine | Admitting: Emergency Medicine

## 2014-04-24 ENCOUNTER — Encounter (HOSPITAL_COMMUNITY): Payer: Self-pay

## 2014-04-24 DIAGNOSIS — K047 Periapical abscess without sinus: Secondary | ICD-10-CM | POA: Insufficient documentation

## 2014-04-24 DIAGNOSIS — J45909 Unspecified asthma, uncomplicated: Secondary | ICD-10-CM | POA: Diagnosis not present

## 2014-04-24 DIAGNOSIS — K088 Other specified disorders of teeth and supporting structures: Secondary | ICD-10-CM | POA: Diagnosis present

## 2014-04-24 MED ORDER — OXYCODONE-ACETAMINOPHEN 5-325 MG PO TABS: 1.0000 | ORAL_TABLET | Freq: Four times a day (QID) | ORAL | Status: DC | PRN

## 2014-04-24 MED ORDER — PENICILLIN V POTASSIUM 500 MG PO TABS: 500.0000 mg | ORAL_TABLET | Freq: Three times a day (TID) | ORAL | Status: DC

## 2014-04-24 NOTE — Discharge Instructions (Signed)
Penicillin as prescribed.  Percocet as prescribed as needed for pain.  Follow-up with dentistry in the next 2-3 days.   Dental Abscess A dental abscess is a collection of infected fluid (pus) from a bacterial infection in the inner part of the tooth (pulp). It usually occurs at the end of the tooth's root.  CAUSES   Severe tooth decay.  Trauma to the tooth that allows bacteria to enter into the pulp, such as a broken or chipped tooth. SYMPTOMS   Severe pain in and around the infected tooth.  Swelling and redness around the abscessed tooth or in the mouth or face.  Tenderness.  Pus drainage.  Bad breath.  Bitter taste in the mouth.  Difficulty swallowing.  Difficulty opening the mouth.  Nausea.  Vomiting.  Chills.  Swollen neck glands. DIAGNOSIS   A medical and dental history will be taken.  An examination will be performed by tapping on the abscessed tooth.  X-rays may be taken of the tooth to identify the abscess. TREATMENT The goal of treatment is to eliminate the infection. You may be prescribed antibiotic medicine to stop the infection from spreading. A root canal may be performed to save the tooth. If the tooth cannot be saved, it may be pulled (extracted) and the abscess may be drained.  HOME CARE INSTRUCTIONS  Only take over-the-counter or prescription medicines for pain, fever, or discomfort as directed by your caregiver.  Rinse your mouth (gargle) often with salt water ( tsp salt in 8 oz [250 ml] of warm water) to relieve pain or swelling.  Do not drive after taking pain medicine (narcotics).  Do not apply heat to the outside of your face.  Return to your dentist for further treatment as directed. SEEK MEDICAL CARE IF:  Your pain is not helped by medicine.  Your pain is getting worse instead of better. SEEK IMMEDIATE MEDICAL CARE IF:  You have a fever or persistent symptoms for more than 2-3 days.  You have a fever and your symptoms  suddenly get worse.  You have chills or a very bad headache.  You have problems breathing or swallowing.  You have trouble opening your mouth.  You have swelling in the neck or around the eye. Document Released: 01/11/2005 Document Revised: 10/06/2011 Document Reviewed: 04/21/2010 [facility] Patient Information 2015 [place], [place]. This information is not intended to replace advice given to you by your health care provider. Make sure you discuss any questions you have with your health care provider.   Emergency Department Resource Guide 1) Find a Doctor and Pay Out of Pocket Although you won't have to find out who is covered by your insurance plan, it is a good idea to ask around and get recommendations. You will then need to call the office and see if the doctor you have chosen will accept you as a new patient and what types of options they offer for patients who are self-pay. Some doctors offer discounts or will set up payment plans for their patients who do not have insurance, but you will need to ask so you aren't surprised when you get to your appointment.  2) Contact Your Local Health Department Not all health departments have doctors that can see patients for sick visits, but many do, so it is worth a call to see if yours does. If you don't know where your local health department is, you can check in your phone book. The CDC also has a tool to help you locate your  state's health department, and many state websites also have listings of all of their local health departments.  3) Find a Walk-in Clinic If your illness is not likely to be very severe or complicated, you may want to try a walk in clinic. These are popping up all over the country in pharmacies, drugstores, and shopping centers. They're usually staffed by nurse practitioners or physician assistants that have been trained to treat common illnesses and complaints. They're usually fairly quick and inexpensive. However, if you have  serious medical issues or chronic medical problems, these are probably not your best option.  No Primary Care Doctor: - Call Health Connect at  [phone] - they can help you locate a primary care doctor that  accepts your insurance, provides certain services, etc. - Physician Referral Service- [phone]  Chronic Pain Problems: Organization         Address  Phone   Notes  [facility]  [phone] Patients need to be referred by their primary care doctor.   Medication Assistance: Organization         Address  Phone   Notes  [facility] [address] [place], [place] [address] [phone]) [phone] --Must be a resident of [facility] -- Must have NO insurance coverage whatsoever (no Medicaid/ Medicare, etc.) -- The pt. MUST have a primary care doctor that directs their care regularly and follows them in the community   MedAssist  [phone]   [employer]  [phone]    Agencies that provide inexpensive medical care: Organization         Address  Phone   Notes  [facility]  [phone]   [facility]    [phone]   [facility] [address] [place], [place] [address] [phone]   [facility] [address], [place] [phone]   [facility]anned Parenthood    [phone]   [facility]    [phone]) [phone]   [facility] and [facility]  [address], [place] Phone:  [phone], Fax:  [phone] Hours of Operation:  9 am - 6 pm, M-F.  Also accepts Medicaid/Medicare and self-pay.  [facility]  [address], [place] Phone: [phone]) [phone], Fax: [phone]. Hours of Operation:  8:30 am - 5:30 pm, M-F.  Also accepts Medicaid and self-pay.  [facility] [place] [address], [place] Phone: [phone]) [phone]   [facility] [address],  [place], [place] [phone], Ext. [phone] Mondays & Thursdays: 7-9 AM.  First 15 patients are seen on a first come, first serve basis.    Medicaid-accepting [facility] Providers:  Organization         Address  Phone   Notes  [facility] [address], [place] [phone] Also accepts self-pay patients.  [facility] [address], [place]  [phone]   [facility] [address], [place] [phone]   [facility] [address] [place] [address], [place] [phone]   Renaye RakersVeita Bland [address], [place]   [phone] Only accepts [place] Access [place] patients after they have their name applied to their card.   Self-Pay (no insurance) in [facility]:  Organization  Address  Phone   Notes  Sickle Cell Patients, [facility] [place] [phone]   [facility] [phone]   [facility] [place], [place] [phone]   [facility]/Dr. Osei-Bonsu  [address] [place] or [address], [place] [phone] Phone number for both [place] and [place] locations is the same.  [facility] [address], [place] [phone]   [facility] [address], [place] or [address] [phone]   [facility] [address], [place] [phone], phone; [phone], fax Sees patients 1st and 3rd Saturday of every month.  Must not qualify for public or private insurance (i.e. Medicaid, Medicare, [place] Health Choice, Veterans' Benefits)  Household income should be no more than 200% of the poverty level The clinic cannot treat you if you are pregnant or think you are pregnant   Sexually transmitted diseases are not treated at the clinic.    Dental Care: Organization         Address  Phone  Notes  [facility] [phone] Accepts children up to age 57 who are enrolled in [place] or [place]; pregnant women with a Medicaid card; and children who have applied for Medicaid or [place] Health Choice, but were declined, whose parents can pay a reduced fee at time of service.  [facility] Department of [facility]  [address], [place] [phone] Accepts children up to age 70 who are enrolled in [place] or [place]; pregnant women with a Medicaid card; and children who have applied for Medicaid or [place] Health Choice, but were declined, whose parents can pay a reduced fee at time of service.  [facility] PRO[facility]  [place] [phone] Patients are seen by appointment only. Walk-ins are not accepted. [place] will see patients 34 years of age and older. Monday - Tuesday (8am-5pm) Most Wednesdays (8:30-5pm) $30 per visit, cash only  [facility] Adult Dental Access PROGRAM  [address], [facility] [phone] Patients are seen by appointment only. Walk-ins are not accepted. [place] will see patients 19 years of age and older. One Wednesday Evening (Monthly: Volunteer Based).  $30 per visit, cash only  [place]  [phone] for adults; Children under age 68, call Graduate Pediatric Dentistry at [phone]. Children aged 22-14, please call [phone] to request a pediatric application.  Dental services are provided in all areas of dental care including fillings, crowns and bridges, complete and partial dentures, implants, gum treatment, root canals, and extractions. Preventive care is also provided. Treatment is provided to both adults and children. Patients  are selected via a lottery and there is often a waiting list.   [facility] [address] [place]  [phone] [email or web address]   [facility] [address] [place], [place] [phone], Ext. [phone] Second and Fourth Thursday of each month, opens at 6:30 AM; Clinic ends at 9 AM.  Patients are seen on a first-come first-served basis, and a limited number are seen during each clinic.   [facility]  [address] [place], [place]  [place], [place] [phone]   Eligibility Requirements You must have lived in [place], [place], or [place] counties for at least the last three months.   You cannot be eligible for state or federal sponsored [employer], including [employer], [place], or [employer].   You generally cannot be eligible for healthcare insurance through your employer.    How to apply: Eligibility screenings are held every Tuesday and Wednesday afternoon from 1:00 pm until 4:00 pm. You do not need an appointment for the interview!  [facility] [address] [place], [place]   [place]  [place] Department  [place]  [phone]    Behavioral Health Resources in the Community: Intensive Outpatient Programs Organization         Address  Phone  Notes  [facility]rop Over[place]. [address], [place], [place] [phone]   [facility] Outpatient [address] [place], [place]   ADS: [facility] [address], [place], [place]   [place] [address],  [place], [place] or [phone]   Substance Abuse Resources Organization         Address  Phone  Notes  Alcohol and Drug Services  [phone]   [place]  [phone]   [facility] [place]   Chinita Pester  [phone]    Residential & Outpatient Substance Abuse Program  [phone]   Psychological Services Organization         Address  Phone  Notes  [facility]  [place]  [phone]   [place] [address] [place] or [phone]    Mobile Crisis Teams Organization         Address  Phone  Notes  [facility], Mobile Crisis Care Unit  [phone]   [facility]  [address] [place], [place]   [place] Levels [address] [place] [phone]    Self-Help/Support Groups Organization         Address  Phone             Notes  [place]. of [place] - variety of support groups  [place] Call for more information  Narcotics Anonymous (NA), [facility] [address], [employer] [place]  2 meetings at this location   Special educational needs teacher         Address  Phone  Notes  ASAP Residential Treatment [place],    [place]  [phone]   [facility]  [address] [place] [address]92, [place], [place]   Quentin [place], [place] [phone] Admissions: 8am-3pm M-F  Incentives Substance Short [address],    [place], [place] [phone]   [facility] [address] Jadene Pierini [place], [place]   [facility] [address],  [place], [place]   [facility] - Intensive Outpatient [address], [place], [place]   [facility] [place].,  [place], [place] or [phone]   [facility] (RTS) [address] [place], [place] Accepts Medicaid  [facility] [place] [address]  [place] [phone] Substance Abuse/Addiction Treatment   [facility]  Resources Organization  Address  Phone  Notes  °[facility]  [phone]) [phone]   °Julie Brannon, PhD [address] [place], [place]   ([phone]) [phone] or ([phone]   ° Behavioral   [address] °[place], [employer] [phone]   °[facility] [address], [place], [place] [phone] Insurance/Medicaid/sponsorship through [facility]  °[facility] [address]                                    [place], [place] [phone] Therapy/tele-psych/case  °[facility] [address]  ° [place], [place] [phone]    °Dr. Arfeen  ([phone]   °[facility] [place]  [employer] [place] [facility] 1) [address],  °2) [address], [place] °3)  [address], [place] [phone]) [phone] °([phone]) [phone] ° °([phone]) [phone]   °[place] Child Abuse Hotline (336) 342-1394 or (336) [phone] (After Hours)    ° ° ° ° °

## 2014-04-24 NOTE — ED Notes (Signed)
C/o dental pain X1 week. Upper front. Patient presents with facial swelling.

## 2014-04-24 NOTE — ED Provider Notes (Signed)
CSN: [id number]     Arrival date & time 04/24/14  0010 History   First MD Initiated Contact with Patient 04/24/14 0215     Chief Complaint  Patient presents with  . Dental Pain     (Consider location/radiation/quality/duration/timing/severity/associated sxs/prior Treatment) HPI Comments: Patient is a 54 year old male who presents with complaints of dental pain and swelling to the roof of his mouth for the past several days. He has no dentist and has not had any oral care in quite some time.  Patient is a 54 y.o. male presenting with tooth pain. The history is provided by the patient.  Dental Pain Location:  Upper Upper teeth location:  3/RU 1st molar and 2/RU 2nd molar Quality:  Constant Severity:  Moderate Onset quality:  Gradual Duration:  3 days Timing:  Constant Progression:  Worsening Chronicity:  New Context: abscess   Relieved by:  Nothing Worsened by:  Nothing tried Ineffective treatments:  None tried   Past Medical History  Diagnosis Date  . Asthma    Past Surgical History  Procedure Laterality Date  . Orthopedic surgery     History reviewed. No pertinent family history. History  Substance Use Topics  . Smoking status: Never Smoker   . Smokeless tobacco: Never Used  . Alcohol Use: Yes     Comment: occasional    Review of Systems  All other systems reviewed and are negative.     Allergies  Review of patient's allergies indicates no known allergies.  Home Medications   Prior to Admission medications   Medication Sig Start Date End Date Taking? Authorizing Provider  oxyCODONE-acetaminophen (PERCOCET) 5-325 MG per tablet Take 1-2 tablets by mouth every 6 (six) hours as needed. 04/24/14   Geoffery Lyonsouglas Hebe Merriwether, MD  penicillin v potassium (VEETID) 500 MG tablet Take 1 tablet (500 mg total) by mouth 3 (three) times daily. 04/24/14   Geoffery Lyonsouglas Mathieu Schloemer, MD   BP 122/88 mmHg  Pulse 71  Temp(Src) 97.9 F (36.6 C) (Oral)  Resp 18  Ht 5\' 4"  (1.626 m)  Wt 140 lb (63.504  kg)  BMI 24.02 kg/m2  SpO2 100% Physical Exam  Constitutional: He is oriented to person, place, and time. He appears well-developed and well-nourished. No distress.  HENT:  Head: Normocephalic and atraumatic.  There is gingival inflammation around the first and second right upper molars. There is what appears to be a small abscess forming adjacent to these teeth in the roof of the mouth. There is no submental crepitus or swelling. There is no trismus.  Neck: Normal range of motion. Neck supple.  Musculoskeletal: Normal range of motion. He exhibits no edema.  Lymphadenopathy:    He has no cervical adenopathy.  Neurological: He is alert and oriented to person, place, and time.  Skin: Skin is warm and dry. He is not diaphoretic.  Nursing note and vitals reviewed.   ED Course  Procedures (including critical care time) Labs Review Labs Reviewed - No data to display  Imaging Review No results found.   EKG Interpretation None      MDM   Final diagnoses:  Dental abscess    Patient appears to have a dental abscess. This will be treated with penicillin, pain medication and follow-up with dentistry. He was given the resource guide to assist him in finding a dentist to follow-up with.    Geoffery Lyonsouglas Deyani Hegarty, MD 04/24/14 [id number]

## 2014-11-10 ENCOUNTER — Emergency Department (HOSPITAL_COMMUNITY): Payer: PRIVATE HEALTH INSURANCE

## 2014-11-10 ENCOUNTER — Encounter (HOSPITAL_COMMUNITY): Payer: Self-pay | Admitting: Emergency Medicine

## 2014-11-10 ENCOUNTER — Emergency Department (HOSPITAL_COMMUNITY)
Admission: EM | Admit: 2014-11-10 | Discharge: 2014-11-10 | Disposition: A | Discharge status: Home / Self Care | Payer: PRIVATE HEALTH INSURANCE | Attending: Emergency Medicine | Admitting: Emergency Medicine

## 2014-11-10 DIAGNOSIS — J45909 Unspecified asthma, uncomplicated: Secondary | ICD-10-CM | POA: Diagnosis not present

## 2014-11-10 DIAGNOSIS — M25461 Effusion, right knee: Secondary | ICD-10-CM | POA: Insufficient documentation

## 2014-11-10 DIAGNOSIS — M7989 Other specified soft tissue disorders: Secondary | ICD-10-CM | POA: Diagnosis not present

## 2014-11-10 DIAGNOSIS — Z792 Long term (current) use of antibiotics: Secondary | ICD-10-CM | POA: Insufficient documentation

## 2014-11-10 DIAGNOSIS — M25561 Pain in right knee: Secondary | ICD-10-CM | POA: Insufficient documentation

## 2014-11-10 MED ORDER — MELOXICAM 7.5 MG PO TABS: 7.5000 mg | ORAL_TABLET | Freq: Every day | ORAL | Status: DC

## 2014-11-10 MED ORDER — KETOROLAC TROMETHAMINE 30 MG/ML IJ SOLN
30.0000 mg | Freq: Once | INTRAMUSCULAR | Status: AC
  Administered 2014-11-10: 30 mg via INTRAMUSCULAR
  Filled 2014-11-10: qty 1

## 2014-11-10 MED ORDER — PREDNISONE 20 MG PO TABS
40.0000 mg | ORAL_TABLET | ORAL | Status: AC
  Administered 2014-11-10: 40 mg via ORAL
  Filled 2014-11-10: qty 2

## 2014-11-10 MED ORDER — PREDNISONE 20 MG PO TABS: 40.0000 mg | ORAL_TABLET | Freq: Every day | ORAL | Status: AC

## 2014-11-10 NOTE — ED Provider Notes (Signed)
CSN: [id number]     Arrival date & time 11/10/14  1254 History   First MD Initiated Contact with Patient 11/10/14 1306     Chief Complaint  Patient presents with  . Joint Swelling    HPI  Patient presents with concern of ongoing pain in the right knee, swelling. Patient has a history long ago of orthopedic intervention, with rod, pins in the right femur. He notes episodic bouts of swelling, pain. He continues to work, notes that with working, he has increasing pain, swelling. He works as a Location managermachine operator. No new loss of sensation distally, no new fever, chills, skin color changes. Pain is minimally improved with ibuprofen. Pain is diffuse throughout the knee, with mild associated swelling. Patient has not seen an orthopedist in some time.  Past Medical History  Diagnosis Date  . Asthma    Past Surgical History  Procedure Laterality Date  . Orthopedic surgery     History reviewed. No pertinent family history. Social History  Substance Use Topics  . Smoking status: Never Smoker   . Smokeless tobacco: Never Used  . Alcohol Use: Yes     Comment: occasional    Review of Systems  Constitutional: Negative for fever.  Respiratory: Negative for shortness of breath.   Cardiovascular: Negative for chest pain.  Musculoskeletal:       Negative aside from HPI  Skin:       Negative aside from HPI  Allergic/Immunologic: Negative for immunocompromised state.  Neurological: Negative for weakness.      Allergies  Review of patient's allergies indicates no known allergies.  Home Medications   Prior to Admission medications   Medication Sig Start Date End Date Taking? Authorizing Provider  meloxicam (MOBIC) 7.5 MG tablet Take 1 tablet (7.5 mg total) by mouth daily. 11/10/14   Gerhard Munchobert Deondrea Markos, MD  oxyCODONE-acetaminophen (PERCOCET) 5-325 MG per tablet Take 1-2 tablets by mouth every 6 (six) hours as needed. 04/24/14   Geoffery Lyonsouglas Delo, MD  penicillin v potassium (VEETID) 500 MG  tablet Take 1 tablet (500 mg total) by mouth 3 (three) times daily. 04/24/14   Geoffery Lyonsouglas Delo, MD  predniSONE (DELTASONE) 20 MG tablet Take 2 tablets (40 mg total) by mouth daily with breakfast. 11/11/14 11/13/14  Gerhard Munchobert Kamie Korber, MD   BP 141/85 mmHg  Pulse 88  Temp(Src) 98.1 F (36.7 C) (Oral)  Resp 18  Wt 142 lb (64.411 kg) Physical Exam  Constitutional: He is oriented to person, place, and time. He appears well-developed. No distress.  HENT:  Head: Normocephalic and atraumatic.  Eyes: Conjunctivae and EOM are normal.  Cardiovascular: Normal rate, regular rhythm and intact distal pulses.   Pulmonary/Chest: Effort normal. No stridor. No respiratory distress.  Musculoskeletal: He exhibits no edema.       Right hip: Normal.       Right knee: He exhibits decreased range of motion, effusion (minimal effusion) and bony tenderness. He exhibits no swelling, no ecchymosis, no deformity, no laceration, no erythema, normal alignment, no LCL laxity, normal patellar mobility, normal meniscus and no MCL laxity. Tenderness found. No medial joint line, no lateral joint line, no MCL, no LCL and no patellar tendon tenderness noted.       Right ankle: Normal.  Neurological: He is alert and oriented to person, place, and time.  Skin: Skin is warm and dry.  Psychiatric: He has a normal mood and affect.  Nursing note and vitals reviewed.   ED Course  Procedures (including critical care time)  Imaging  Review I have personally reviewed and evaluated the images as part of my medical decision-making.     MDM   Final diagnoses:  Knee pain, right   Patient presents with ongoing episodic knee pain. Here he has a mild effusion, but no evidence for septic arthralgia. No evidence for systemic pathology. No evidence for new fracture or hardware issue. Patient received analgesia, steroids, was discharged in stable condition with orthopedic follow-up.  Gerhard Munch, MD 11/10/14 1359

## 2014-11-10 NOTE — ED Notes (Signed)
Patient c/o swelling and pain to right knee. Denies any new injuries. Per patient had injury 15 years ago and has pinns in knee and rod to right femur. Per patient has had this before in which he has to have fluid removed from knee.

## 2014-11-10 NOTE — Discharge Instructions (Signed)
As discussed, your ongoing right knee pain, does report a take all medication as directed, and be sure to follow-up with our orthopedic physicians for ongoing management, including consideration of physical therapy.  Return here for concerning changes in your condition.

## 2014-12-02 ENCOUNTER — Encounter: Payer: Self-pay | Admitting: *Deleted

## 2015-06-06 ENCOUNTER — Emergency Department (HOSPITAL_COMMUNITY)
Admission: EM | Admit: 2015-06-06 | Discharge: 2015-06-07 | Disposition: A | Discharge status: Home / Self Care | Payer: No Typology Code available for payment source | Attending: Emergency Medicine | Admitting: Emergency Medicine

## 2015-06-06 DIAGNOSIS — M549 Dorsalgia, unspecified: Secondary | ICD-10-CM | POA: Diagnosis present

## 2015-06-06 DIAGNOSIS — Z79899 Other long term (current) drug therapy: Secondary | ICD-10-CM | POA: Diagnosis not present

## 2015-06-06 DIAGNOSIS — J45909 Unspecified asthma, uncomplicated: Secondary | ICD-10-CM | POA: Diagnosis not present

## 2015-06-06 DIAGNOSIS — M545 Low back pain, unspecified: Secondary | ICD-10-CM

## 2015-06-06 DIAGNOSIS — M25551 Pain in right hip: Secondary | ICD-10-CM | POA: Insufficient documentation

## 2015-06-07 ENCOUNTER — Encounter (HOSPITAL_COMMUNITY): Payer: Self-pay

## 2015-06-07 ENCOUNTER — Emergency Department (HOSPITAL_COMMUNITY): Payer: No Typology Code available for payment source

## 2015-06-07 MED ORDER — CYCLOBENZAPRINE HCL 5 MG PO TABS: 5.0000 mg | ORAL_TABLET | Freq: Three times a day (TID) | ORAL | Status: DC | PRN

## 2015-06-07 MED ORDER — NAPROXEN 500 MG PO TABS: ORAL_TABLET | ORAL | Status: DC

## 2015-06-07 MED ORDER — KETOROLAC TROMETHAMINE 60 MG/2ML IM SOLN
60.0000 mg | Freq: Once | INTRAMUSCULAR | Status: AC
  Administered 2015-06-07: 60 mg via INTRAMUSCULAR
  Filled 2015-06-07: qty 2
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
## 2015-06-07 NOTE — Discharge Instructions (Signed)
Use heat and ice for comfort. Take the medications as prescribed. You need to get another appointment with Dr Romeo Apple, to have him look at your right hip, you rod may be getting loose and causing the pain in your right hip area.    Cryotherapy Cryotherapy is when you put ice on your injury. Ice helps lessen pain and puffiness (swelling) after an injury. Ice works the best when you start using it in the first 24 to 48 hours after an injury. HOME CARE  Put a dry or damp towel between the ice pack and your skin.  You may press gently on the ice pack.  Leave the ice on for no more than 10 to 20 minutes at a time.  Check your skin after 5 minutes to make sure your skin is okay.  Rest at least 20 minutes between ice pack uses.  Stop using ice when your skin loses feeling (numbness).  Do not use ice on someone who cannot tell you when it hurts. This includes small children and people with memory problems (dementia). GET HELP RIGHT AWAY IF:  You have white spots on your skin.  Your skin turns blue or pale.  Your skin feels waxy or hard.  Your puffiness gets worse. MAKE SURE YOU:   Understand these instructions.  Will watch your condition.  Will get help right away if you are not doing well or get worse.   This information is not intended to replace advice given to you by your health care provider. Make sure you discuss any questions you have with your health care provider.   Document Released: 06/30/2007 Document Revised: 04/05/2011 Document Reviewed: 09/03/2010 Elsevier Interactive Patient Education 2016 Elsevier Inc.  [employer] Therapy Heat therapy can help ease sore, stiff, injured, and tight muscles and joints. Heat relaxes your muscles, which may help ease your pain. Heat therapy should only be used on old, pre-existing, or long-lasting (chronic) injuries. Do not use heat therapy unless told by your doctor. HOW TO USE HEAT THERAPY There are several different kinds of heat  therapy, including:  Moist heat pack.  Warm water bath.  Hot water bottle.  Electric heating pad.  Heated gel pack.  Heated wrap.  Electric heating pad. GENERAL HEAT THERAPY RECOMMENDATIONS   Do not sleep while using heat therapy. Only use heat therapy while you are awake.  Your skin may turn pink while using heat therapy. Do not use heat therapy if your skin turns red.  Do not use heat therapy if you have new pain.  High heat or long exposure to heat can cause burns. Be careful when using heat therapy to avoid burning your skin.  Do not use heat therapy on areas of your skin that are already irritated, such as with a rash or sunburn. GET HELP IF:   You have blisters, redness, swelling (puffiness), or numbness.  You have new pain.  Your pain is worse. MAKE SURE YOU:  Understand these instructions.  Will watch your condition.  Will get help right away if you are not doing well or get worse.   This information is not intended to replace advice given to you by your health care provider. Make sure you discuss any questions you have with your health care provider.   Document Released: 04/05/2011 Document Revised: 02/01/2014 Document Reviewed: 03/06/2013 Elsevier Interactive Patient Education 2016 Elsevier Inc.   Back Pain, Adult Back pain is very common. The pain often gets better over time. The cause of back  pain is usually not dangerous. Most people can learn to manage their back pain on their own.  HOME CARE  Watch your back pain for any changes. The following actions may help to lessen any pain you are feeling:  Stay active. Start with short walks on flat ground if you can. Try to walk farther each day.  Exercise regularly as told by your doctor. Exercise helps your back heal faster. It also helps avoid future injury by keeping your muscles strong and flexible.  Do not sit, drive, or stand in one place for more than 30 minutes.  Do not stay in bed. Resting  more than 1-2 days can slow down your recovery.  Be careful when you bend or lift an object. Use good form when lifting:  Bend at your knees.  Keep the object close to your body.  Do not twist.  Sleep on a firm mattress. Lie on your side, and bend your knees. If you lie on your back, put a pillow under your knees.  Take medicines only as told by your doctor.  Put ice on the injured area.  Put ice in a plastic bag.  Place a towel between your skin and the bag.  Leave the ice on for 20 minutes, 2-3 times a day for the first 2-3 days. After that, you can switch between ice and heat packs.  Avoid feeling anxious or stressed. Find good ways to deal with stress, such as exercise.  Maintain a healthy weight. Extra weight puts stress on your back. GET HELP IF:   You have pain that does not go away with rest or medicine.  You have worsening pain that goes down into your legs or buttocks.  You have pain that does not get better in one week.  You have pain at night.  You lose weight.  You have a fever or chills. GET HELP RIGHT AWAY IF:   You cannot control when you poop (bowel movement) or pee (urinate).  Your arms or legs feel weak.  Your arms or legs lose feeling (numbness).  You feel sick to your stomach (nauseous) or throw up (vomit).  You have belly (abdominal) pain.  You feel like you may pass out (faint).   This information is not intended to replace advice given to you by your health care provider. Make sure you discuss any questions you have with your health care provider.   Document Released: 06/30/2007 Document Revised: 02/01/2014 Document Reviewed: 05/15/2013 Elsevier Interactive Patient Education [employer] Elsevier Inc.

## 2015-06-07 NOTE — ED Notes (Signed)
Pt reports chronic lower back pain with shooting pains down both legs.  Pt denies recent trauma or injury, states he has been told he had arthritis in his back.

## 2015-07-14 ENCOUNTER — Ambulatory Visit: Payer: PRIVATE HEALTH INSURANCE | Admitting: Orthopedic Surgery

## 2015-07-22 ENCOUNTER — Encounter: Payer: Self-pay | Admitting: Orthopedic Surgery

## 2015-07-22 ENCOUNTER — Ambulatory Visit (INDEPENDENT_AMBULATORY_CARE_PROVIDER_SITE_OTHER): Payer: PRIVATE HEALTH INSURANCE | Admitting: Orthopedic Surgery

## 2015-07-22 VITALS — BP 137/93 | HR 73 | Ht 64.0 in | Wt 150.0 lb

## 2015-07-22 DIAGNOSIS — M4316 Spondylolisthesis, lumbar region: Secondary | ICD-10-CM

## 2015-07-22 NOTE — Patient Instructions (Signed)
CALL THERAPY DEPT TO SCHEDULE THERAPY VISITS [phone]  ADVIL 800MG  AS NEEDED  FOLLOW UP AS NEEDED

## 2015-07-22 NOTE — Progress Notes (Signed)
Chief Complaint  Patient presents with  . Hip Pain    hip surgery years ago    HPI 55 year old male [facility] many [facility] presents for evaluation of right leg pain right leg giving way and radiating pain into his right hip  Location right leg Reason injury none previous injury yes Pain is moderate to severe Pain is intermittent Pain is described as radiating without numbness or tingling but deep dull ache  ROS   He says he does not have back pain He denies numbness or tingling in the leg He denies bowel bladder dysfunction He has stiffness in the right knee  Past Medical History  Diagnosis Date  . Asthma     Past Surgical History  Procedure Laterality Date  . Orthopedic surgery     History reviewed. No pertinent family history. Social History  Substance Use Topics  . Smoking status: Never Smoker   . Smokeless tobacco: Never Used  . Alcohol Use: No     Comment: occasional    Current outpatient prescriptions:  .  cyclobenzaprine (FLEXERIL) 5 MG tablet, Take 1 tablet (5 mg total) by mouth 3 (three) times daily as needed (muscle soreness)., Disp: 30 tablet, Rfl: 0 .  ibuprofen (ADVIL,MOTRIN) 600 MG tablet, Take 600 mg by mouth every 6 (six) hours as needed., Disp: , Rfl:  .  meloxicam (MOBIC) 7.5 MG tablet, Take 1 tablet (7.5 mg total) by mouth daily., Disp: 15 tablet, Rfl: 0 .  naproxen (NAPROSYN) 500 MG tablet, Take 1 po BID with food prn pain, Disp: 30 tablet, Rfl: 0 .  oxyCODONE-acetaminophen (PERCOCET) 5-325 MG per tablet, Take 1-2 tablets by mouth every 6 (six) hours as needed., Disp: 20 tablet, Rfl: 0 .  penicillin v potassium (VEETID) 500 MG tablet, Take 1 tablet (500 mg total) by mouth 3 (three) times daily., Disp: 30 tablet, Rfl: 0  BP 137/93 mmHg  Pulse 73  Ht 5\' 4"  (1.626 m)  Wt 150 lb (68.04 kg)  BMI 25.73 kg/m2  Physical Exam  Constitutional: He is oriented to person, place, and time. He appears well-developed  and well-nourished. No distress.  Cardiovascular: Normal rate and intact distal pulses.   Neurological: He is alert and oriented to person, place, and time.  Skin: Skin is warm and dry. No rash noted. He is not diaphoretic. No erythema. No pallor.  Psychiatric: He has a normal mood and affect. His behavior is normal. Judgment and thought content normal.    Ortho Exam  PW has a gait disturbance favoring his right leg with a limp  His legs seem to be equal length. Left hip Left hip flexion normal knee flexion normal, hip and knee stable. Muscle tone normal. No tenderness.  Right hip flexion is normal but right knee flexion only 90. Both hip and knee are stable. Muscle tone normal. Tenderness none in the hip or knee  Tenderness right side lower back midline lower back  Increased muscle tension right side lower back  Reflexes normal no neurologic deficit in terms of sensory changes in either right or left leg reflexes equal in each knee.   ASSESSMENT: My personal interpretation of the images:  X-rays show a healed femur fracture with IM nail no complications from the hardware  Lumbar spine x-rays show degenerative disc disease reports are noted below  DG HIP (WITH OR WITHOUT PELVIS) 2-3V RIGHT  COMPARISON: None.  FINDINGS: Status post RIGHT femur ORIF for healed mid femur  fracture with extensive callus. 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 No acute fracture deformity. No dislocation. No destructive bony lesions. Partially imaged scrotoliths.  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   Electronically Signed  By: Awilda Metroourtnay Bloomer M.D.  On: 06/07/2015 01:55 FINDINGS: Lumbar vertebral bodies intact. Grade 1 L5-S1 anterolisthesis with chronic bilateral L5  pars interarticularis defects. Moderate to severe L5-S1 disc height loss, vacuum disc and endplate spurring compatible with degenerative disc. No destructive bony lesions. Moderate amount of retained large bowel stool. Sacroiliac joints are symmetric.   IMPRESSION: Grade 1 L5-S1 anterolisthesis with chronic bilateral L5 pars interarticularis defects.   No acute fracture deformity.     Electronically Signed   By: Awilda Metroourtnay  Bloomer M.D.   On: 06/07/2015 01:53   PLAN Recommend physical therapy for back pain and anti-inflammatories no surgery needed no hardware removal needed.  Fuller CanadaStanley Harrison, MD 07/22/2015 4:23 PM

## 2016-01-28 ENCOUNTER — Emergency Department (HOSPITAL_COMMUNITY): Payer: PRIVATE HEALTH INSURANCE

## 2016-01-28 ENCOUNTER — Emergency Department (HOSPITAL_COMMUNITY)
Admission: EM | Admit: 2016-01-28 | Discharge: 2016-01-29 | Disposition: A | Discharge status: Home / Self Care | Payer: PRIVATE HEALTH INSURANCE | Attending: Emergency Medicine | Admitting: Emergency Medicine

## 2016-01-28 ENCOUNTER — Encounter (HOSPITAL_COMMUNITY): Payer: Self-pay | Admitting: Emergency Medicine

## 2016-01-28 DIAGNOSIS — J45901 Unspecified asthma with (acute) exacerbation: Secondary | ICD-10-CM | POA: Insufficient documentation

## 2016-01-28 DIAGNOSIS — Z79899 Other long term (current) drug therapy: Secondary | ICD-10-CM | POA: Insufficient documentation

## 2016-01-28 DIAGNOSIS — J4521 Mild intermittent asthma with (acute) exacerbation: Secondary | ICD-10-CM

## 2016-01-28 DIAGNOSIS — E876 Hypokalemia: Secondary | ICD-10-CM

## 2016-01-28 LAB — BASIC METABOLIC PANEL
ANION GAP: 7 (ref 5–15)
BUN: 15 mg/dL (ref 6–20)
CALCIUM: 8.9 mg/dL (ref 8.9–10.3)
CO2: 26 mmol/L (ref 22–32)
Chloride: 105 mmol/L (ref 101–111)
Creatinine, Ser: 1.28 mg/dL — ABNORMAL HIGH (ref 0.61–1.24)
GFR calc Af Amer: >60 mL/min (ref >60)
Glucose, Bld: 109 mg/dL — ABNORMAL HIGH (ref 65–99)
POTASSIUM: 3.1 mmol/L — AB (ref 3.5–5.1)
SODIUM: 138 mmol/L (ref 135–145)

## 2016-01-28 MED ORDER — IPRATROPIUM-ALBUTEROL 0.5-2.5 (3) MG/3ML IN SOLN
RESPIRATORY_TRACT | Status: AC
  Administered 2016-01-28: 3 mL
  Filled 2016-01-28: qty 3

## 2016-01-28 MED ORDER — ALBUTEROL SULFATE (2.5 MG/3ML) 0.083% IN NEBU
INHALATION_SOLUTION | RESPIRATORY_TRACT | Status: AC
  Administered 2016-01-28: 23:00:00
  Filled 2016-01-28: qty 3

## 2016-01-28 MED ORDER — POTASSIUM CHLORIDE CRYS ER 20 MEQ PO TBCR
40.0000 meq | EXTENDED_RELEASE_TABLET | Freq: Once | ORAL | Status: AC
  Administered 2016-01-28: 40 meq via ORAL
  Filled 2016-01-28: qty 2

## 2016-01-28 MED ORDER — ALBUTEROL SULFATE HFA 108 (90 BASE) MCG/ACT IN AERS
2.0000 | INHALATION_SPRAY | Freq: Once | RESPIRATORY_TRACT | Status: AC
  Administered 2016-01-28: 2 via RESPIRATORY_TRACT
  Filled 2016-01-28: qty 6.7

## 2016-01-28 MED ORDER — DEXAMETHASONE SODIUM PHOSPHATE 4 MG/ML IJ SOLN
8.0000 mg | Freq: Once | INTRAMUSCULAR | Status: AC
  Administered 2016-01-28: 8 mg via INTRAMUSCULAR
  Filled 2016-01-28: qty 2

## 2016-01-28 NOTE — ED Provider Notes (Signed)
[facility]-EMERGENCY DEPT Provider Note   CSN: [id number] Arrival date & time: 01/28/16  2233     History   Chief Complaint Chief Complaint  Patient presents with  . Shortness of Breath    HPI Tyler Chang is a 56 y.o. male.  Patient is a 56 year old male who presents to the emergency department with a complaint of shortness of breath.  The patient states that he's been having some problems with asthma exacerbation from time to time. He is usually able to control it by stepping outside and getting fresh air for few minutes. Today he noted that the shortness of breath continued to get worse. His wheezing became worse. And he presents now because of difficulty with shortness of breath and what he believes to be an exacerbation of his asthma. The patient has a history of asthma. He is not a smoker. He has not been exposed to any tense or toxic fumes that he is aware of. He's not had any recent fever or chills, and to his knowledge has not been around 1 his been ill recently. He has not been out of the country recently. He states that he was supposed to have been on a maintenance inhaler, but he has not had that for several years. Patient presents now for assistance with these issues.      Past Medical History:  Diagnosis Date  . Asthma     Patient Active Problem List   Diagnosis Date Noted  . FATIGUE 05/02/2009  . ELEVATED BLOOD PRESSURE WITHOUT DIAGNOSIS OF HYPERTENSION 05/02/2009    Past Surgical History:  Procedure Laterality Date  . ORTHOPEDIC SURGERY         Home Medications    Prior to Admission medications   Medication Sig Start Date End Date Taking? Authorizing Provider  cyclobenzaprine (FLEXERIL) 5 MG tablet Take 1 tablet (5 mg total) by mouth 3 (three) times daily as needed (muscle soreness). 06/07/15   Devoria Albe, MD  ibuprofen (ADVIL,MOTRIN) 600 MG tablet Take 600 mg by mouth every 6 (six) hours as needed.    Historical Provider, MD  meloxicam (MOBIC) 7.5 MG  tablet Take 1 tablet (7.5 mg total) by mouth daily. 11/10/14   Gerhard Munch, MD  naproxen (NAPROSYN) 500 MG tablet Take 1 po BID with food prn pain 06/07/15   Devoria Albe, MD  oxyCODONE-acetaminophen (PERCOCET) 5-325 MG per tablet Take 1-2 tablets by mouth every 6 (six) hours as needed. 04/24/14   Geoffery Lyons, MD  penicillin v potassium (VEETID) 500 MG tablet Take 1 tablet (500 mg total) by mouth 3 (three) times daily. 04/24/14   Geoffery Lyons, MD    Family History No family history on file.  Social History Social History  Substance Use Topics  . Smoking status: Never Smoker  . Smokeless tobacco: Never Used  . Alcohol use No     Comment: occasional     Allergies   Patient has no known allergies.   Review of Systems Review of Systems  Constitutional: Negative for activity change, chills and fever.       All ROS Neg except as noted in HPI  HENT: Positive for congestion. Negative for nosebleeds.   Eyes: Negative for photophobia and discharge.  Respiratory: Positive for chest tightness, shortness of breath and wheezing. Negative for cough.   Cardiovascular: Negative for chest pain, palpitations and leg swelling.  Gastrointestinal: Negative for abdominal pain and blood in stool.  Genitourinary: Negative for dysuria, frequency and hematuria.  Musculoskeletal: Negative for  arthralgias, back pain and neck pain.  Skin: Negative.   Neurological: Negative for dizziness, seizures and speech difficulty.  Psychiatric/Behavioral: Negative for confusion and hallucinations.     Physical Exam Updated Vital Signs BP (!) 162/111   Pulse 80   Temp 97.5 F (36.4 C)   Resp (!) 29   Ht 5\' 4"  (1.626 m)   Wt 65.8 kg   SpO2 99%   BMI 24.89 kg/m   Physical Exam  Constitutional: He is oriented to person, place, and time. He appears well-developed and well-nourished.  Non-toxic appearance.  HENT:  Head: Normocephalic.  Right Ear: Tympanic membrane and external ear normal.  Left Ear:  Tympanic membrane and external ear normal.  Eyes: EOM and lids are normal. Pupils are equal, round, and reactive to light.  Neck: Normal range of motion. Neck supple. Carotid bruit is not present.  Cardiovascular: Normal rate, regular rhythm, normal heart sounds, intact distal pulses and normal pulses.   Pulmonary/Chest: No respiratory distress. He has wheezes.  There is tachypnea present. The patient has end expiratory wheezes bilaterally. He is not using the assessory  muscles to breathe.  Abdominal: Soft. Bowel sounds are normal. There is no tenderness. There is no guarding.  Musculoskeletal: Normal range of motion. He exhibits no edema or tenderness.  Negative Homans sign  Lymphadenopathy:       Head (right side): No submandibular adenopathy present.       Head (left side): No submandibular adenopathy present.    He has no cervical adenopathy.  Neurological: He is alert and oriented to person, place, and time. He has normal strength. No cranial nerve deficit or sensory deficit.  Skin: Skin is warm and dry.  Psychiatric: He has a normal mood and affect. His speech is normal.  Nursing note and vitals reviewed.    ED Treatments / Results  Labs (all labs ordered are listed, but only abnormal results are displayed) Labs Reviewed - No data to display  EKG  EKG Interpretation None       Radiology No results found.  Procedures Procedures (including critical care time)  Medications Ordered in ED Medications  ipratropium-albuterol (DUONEB) 0.5-2.5 (3) MG/3ML nebulizer solution (3 mLs  Given 01/28/16 2242)     Initial Impression / Assessment and Plan / ED Course  I have reviewed the triage vital signs and the nursing notes.  Pertinent labs & imaging results that were available during my care of the patient were reviewed by me and considered in my medical decision making (see chart for details).  Clinical Course     **I have reviewed nursing notes, vital signs, and all  appropriate lab and imaging results for this patient.*  Final Clinical Impressions(s) / ED Diagnoses  Blood pressure is elevated at 162/111, respiratory rate is 29, otherwise the vital signs within normal limits. Pulse oximetry is 99% on room air.  Patient having tachypnea and increase respiratory effort. He has end-expiratory wheezes present. Patient treated with albuterol and Atrovent nebulizer treatments.   The electrocardiogram shows normal sinus rhythm at 75 bpm. There is no evidence of acute event on the electrocardiogram.   The basic metabolic panel shows potassium be slightly low at 3.1. The creatinine is elevated at 1.28. Chest x-ray shows no active disease. Patient will be treated with oral potassium. He will be encouraged to increase fluids high in potassium. The patient will also be encouraged to have his renal status rechecked by his primary physician. The patient will be given an albuterol  inhaler. He will be placed on a short course of steroids. Questions were answered. Patient will be discharged home. He is encouraged to return immediately if any changes, problems, or concerns.    Final diagnoses:  None    New Prescriptions New Prescriptions   No medications on file     Ivery Quale, PA-C 01/29/16 0023    Loren Racer, MD 01/30/16 1028

## 2016-01-28 NOTE — ED Triage Notes (Signed)
Pt c/o sudden onset of sob tonight.

## 2016-01-29 MED ORDER — DEXAMETHASONE 4 MG PO TABS: 4.0000 mg | ORAL_TABLET | Freq: Two times a day (BID) | ORAL | 0 refills | Status: DC

## 2016-01-29 NOTE — Discharge Instructions (Signed)
Your examination favors an exacerbation of your asthma. Your electrocardiogram seems to be within normal limits. Your potassium is slightly low. Please increase foods high in potassium, and have your doctor to follow this with you. Your kidney function is also beginning to elevate. Please discuss this with your primary doctor. Use your albuterol 2 puffs every 4 hours as needed for wheezing or difficulty with breathing. Please return to the emergency department if he cannot be seen by your primary physician concerning her breathing..Marland Kitchen

## 2017-08-05 ENCOUNTER — Encounter (HOSPITAL_COMMUNITY): Payer: Self-pay

## 2017-08-05 ENCOUNTER — Other Ambulatory Visit: Payer: Self-pay

## 2017-08-05 ENCOUNTER — Emergency Department (HOSPITAL_COMMUNITY): Payer: Managed Care, Other (non HMO)

## 2017-08-05 ENCOUNTER — Emergency Department (HOSPITAL_COMMUNITY)
Admission: EM | Admit: 2017-08-05 | Discharge: 2017-08-05 | Disposition: A | Discharge status: Home / Self Care | Payer: Managed Care, Other (non HMO) | Attending: Emergency Medicine | Admitting: Emergency Medicine

## 2017-08-05 DIAGNOSIS — J45909 Unspecified asthma, uncomplicated: Secondary | ICD-10-CM | POA: Insufficient documentation

## 2017-08-05 DIAGNOSIS — M79671 Pain in right foot: Secondary | ICD-10-CM | POA: Diagnosis not present

## 2017-08-05 MED ORDER — TRAMADOL HCL 50 MG PO TABS: 50.0000 mg | ORAL_TABLET | Freq: Four times a day (QID) | ORAL | 0 refills | Status: DC | PRN

## 2017-08-05 NOTE — ED Triage Notes (Signed)
Pt reports right foot pain x 2 days, denies injury, points to instep of foot where pain is located.

## 2017-08-05 NOTE — ED Provider Notes (Signed)
[facility] EMERGENCY DEPARTMENT Provider Note   CSN: [id number] Arrival date & time: 08/05/17  0209     History   Chief Complaint Chief Complaint  Patient presents with  . Foot Pain    HPI Tyler Chang is a 57 y.o. male.  Patient complains of right foot pain.  He reports that the pain has been present for 2 days.  Pain is on the instep area of the foot and worsens when he walks and bears weight.  He denies injury.  No history of diabetes.     Past Medical History:  Diagnosis Date  . Asthma     Patient Active Problem List   Diagnosis Date Noted  . FATIGUE 05/02/2009  . ELEVATED BLOOD PRESSURE WITHOUT DIAGNOSIS OF HYPERTENSION 05/02/2009    Past Surgical History:  Procedure Laterality Date  . ORTHOPEDIC SURGERY          Home Medications    Prior to Admission medications   Medication Sig Start Date End Date Taking? Authorizing Provider  loratadine (ALLERGY RELIEF) 10 MG tablet Take 10 mg by mouth daily as needed for allergies.    [provider]  naproxen sodium (ALEVE) 220 MG tablet Take 220-440 mg by mouth daily as needed (for pain).    [provider]    Family History No family history on file.  Social History Social History   Tobacco Use  . Smoking status: Never Smoker  . Smokeless tobacco: Never Used  Substance Use Topics  . Alcohol use: No    Comment: occasional  . Drug use: No     Allergies   Patient has no known allergies.   Review of Systems Review of Systems  Musculoskeletal:       Foot pain  All other systems reviewed and are negative.    Physical Exam Updated Vital Signs BP 127/90   Pulse 70   Temp 97.9 F (36.6 C)   Resp 17   Ht 5\' 4"  (1.626 m)   Wt 63.5 kg (140 lb)   SpO2 100%   BMI 24.03 kg/m   Physical Exam  Constitutional: He is oriented to person, place, and time. He appears well-developed and well-nourished. No distress.  HENT:  Head: Normocephalic and atraumatic.  Right Ear: Hearing  normal.  Left Ear: Hearing normal.  Nose: Nose normal.  Mouth/Throat: Oropharynx is clear and moist and mucous membranes are normal.  Eyes: Pupils are equal, round, and reactive to light. Conjunctivae and EOM are normal.  Neck: Normal range of motion. Neck supple.  Cardiovascular: Regular rhythm, S1 normal and S2 normal. Exam reveals no gallop and no friction rub.  No murmur heard. Pulmonary/Chest: Effort normal and breath sounds normal. No respiratory distress. He exhibits no tenderness.  Abdominal: Soft. Normal appearance and bowel sounds are normal. There is no hepatosplenomegaly. There is no tenderness. There is no rebound, no guarding, no tenderness at McBurney's point and negative Murphy's sign. No hernia.  Musculoskeletal: Normal range of motion.       Right foot: There is tenderness. There is no deformity and no laceration.       Feet:  Neurological: He is alert and oriented to person, place, and time. He has normal strength. No cranial nerve deficit or sensory deficit. Coordination normal. GCS eye subscore is 4. GCS verbal subscore is 5. GCS motor subscore is 6.  Skin: Skin is warm, dry and intact. No rash noted. No cyanosis.  Psychiatric: He has a normal mood and affect.  His speech is normal and behavior is normal. Thought content normal.  Nursing note and vitals reviewed.    ED Treatments / Results  Labs (all labs ordered are listed, but only abnormal results are displayed) Labs Reviewed - No data to display  EKG None  Radiology Dg Foot Complete Right  Result Date: 08/05/2017 CLINICAL DATA:  Medial foot pain for 2 days on the right. EXAM: RIGHT FOOT COMPLETE - 3+ VIEW COMPARISON:  06/20/2009 FINDINGS: There is no evidence of fracture or dislocation. First MTP joint narrowing with mild spurring. IMPRESSION: 1. No acute finding. 2. Mild degenerative spurring at the first MTP joint. Electronically Signed   By: Marnee SpringJonathon  Watts M.D.   On: 08/05/2017 02:43     Procedures Procedures (including critical care time)  Medications Ordered in ED Medications - No data to display   Initial Impression / Assessment and Plan / ED Course  I have reviewed the triage vital signs and the nursing notes.  Pertinent labs & imaging results that were available during my care of the patient were reviewed by me and considered in my medical decision making (see chart for details).     Patient presents with complaints of nontraumatic pain of the right foot.  Pain is in the area of the instep, no ankle pain.  No overlying skin changes to suggest infection.  No significant warmth or swelling to suggest gout.  X-ray unremarkable.  He is already taking Advil without improvement.  Final Clinical Impressions(s) / ED Diagnoses   Final diagnoses:  Foot pain, right    ED Discharge Orders    None       Sharlett Lienemann, Canary Brimhristopher J, MD 08/05/17 [phone]

## 2017-08-07 ENCOUNTER — Other Ambulatory Visit: Payer: Self-pay

## 2017-08-07 ENCOUNTER — Emergency Department (HOSPITAL_COMMUNITY): Payer: Managed Care, Other (non HMO)

## 2017-08-07 ENCOUNTER — Emergency Department (HOSPITAL_COMMUNITY)
Admission: EM | Admit: 2017-08-07 | Discharge: 2017-08-08 | Disposition: A | Discharge status: Home / Self Care | Payer: Managed Care, Other (non HMO) | Attending: Emergency Medicine | Admitting: Emergency Medicine

## 2017-08-07 ENCOUNTER — Encounter (HOSPITAL_COMMUNITY): Payer: Self-pay | Admitting: Emergency Medicine

## 2017-08-07 DIAGNOSIS — M25571 Pain in right ankle and joints of right foot: Secondary | ICD-10-CM | POA: Diagnosis not present

## 2017-08-07 DIAGNOSIS — M76821 Posterior tibial tendinitis, right leg: Secondary | ICD-10-CM | POA: Diagnosis not present

## 2017-08-07 DIAGNOSIS — J45909 Unspecified asthma, uncomplicated: Secondary | ICD-10-CM | POA: Insufficient documentation

## 2017-08-07 DIAGNOSIS — M25471 Effusion, right ankle: Secondary | ICD-10-CM

## 2017-08-07 DIAGNOSIS — R2241 Localized swelling, mass and lump, right lower limb: Secondary | ICD-10-CM | POA: Insufficient documentation

## 2017-08-07 LAB — BASIC METABOLIC PANEL
Anion gap: 9 (ref 5–15)
BUN: 14 mg/dL (ref 6–20)
CHLORIDE: 107 mmol/L (ref 98–111)
CO2: 23 mmol/L (ref 22–32)
Calcium: 9.1 mg/dL (ref 8.9–10.3)
Creatinine, Ser: 0.86 mg/dL (ref 0.61–1.24)
GFR calc Af Amer: >60 mL/min (ref >60)
GFR calc non Af Amer: >60 mL/min (ref >60)
Glucose, Bld: 106 mg/dL — ABNORMAL HIGH (ref 70–99)
POTASSIUM: 4.2 mmol/L (ref 3.5–5.1)
SODIUM: 139 mmol/L (ref 135–145)

## 2017-08-07 LAB — CBC WITH DIFFERENTIAL/PLATELET
Abs Immature Granulocytes: 0 10*3/uL (ref 0.0–0.1)
Basophils Absolute: 0 10*3/uL (ref 0.0–0.1)
Basophils Relative: 1 %
EOS ABS: 0.2 10*3/uL (ref 0.0–0.7)
EOS PCT: 3 %
HEMATOCRIT: 36.6 % — AB (ref 39.0–52.0)
HEMOGLOBIN: 11.9 g/dL — AB (ref 13.0–17.0)
Immature Granulocytes: 0 %
LYMPHS ABS: 2.4 10*3/uL (ref 0.7–4.0)
LYMPHS PCT: 33 %
MCH: 29.7 pg (ref 26.0–34.0)
MCHC: 32.5 g/dL (ref 30.0–36.0)
MCV: 91.3 fL (ref 78.0–100.0)
MONO ABS: 0.7 10*3/uL (ref 0.1–1.0)
MONOS PCT: 10 %
Neutro Abs: 4 10*3/uL (ref 1.7–7.7)
Neutrophils Relative %: 53 %
Platelets: 263 10*3/uL (ref 150–400)
RBC: 4.01 MIL/uL — ABNORMAL LOW (ref 4.22–5.81)
RDW: 13.1 % (ref 11.5–15.5)
WBC: 7.4 10*3/uL (ref 4.0–10.5)

## 2017-08-07 LAB — URIC ACID: Uric Acid, Serum: 5.3 mg/dL (ref 3.7–8.6)

## 2017-08-07 NOTE — ED Provider Notes (Cosign Needed)
[facility] EMERGENCY DEPARTMENT Provider Note   CSN: [id number] Arrival date & time: 08/07/17  1922     History   Chief Complaint Chief Complaint  Patient presents with  . Ankle Pain    HPI Tyler Chang is a 57 y.o. male.  HPI Tyler Chang is a 57 y.o. male with no medical problems, presents to emergency department with complaint of right foot pain and swelling.  Patient states his pain started about 4 days ago.  He denies any injuries.  He was seen in emergency department 2 days ago for the same and was told to take NSAIDs and follow-up with his doctor.  He states yesterday the foot swelling has gotten much worse, and today the foot is doing more swollen and is painful to walk on it.  Patient has been applying lidocaine patches, as well as wrapping it in an Ace wrap.  He denies any injuries.  He denies any fever or chills.  He denies any numbness or weakness distally.  He denies any history of gout.  Taking naproxen with no relief.  Past Medical History:  Diagnosis Date  . Asthma     Patient Active Problem List   Diagnosis Date Noted  . FATIGUE 05/02/2009  . ELEVATED BLOOD PRESSURE WITHOUT DIAGNOSIS OF HYPERTENSION 05/02/2009    Past Surgical History:  Procedure Laterality Date  . ORTHOPEDIC SURGERY          Home Medications    Prior to Admission medications   Medication Sig Start Date End Date Taking? Authorizing Provider  loratadine (ALLERGY RELIEF) 10 MG tablet Take 10 mg by mouth daily as needed for allergies.    [provider]  naproxen sodium (ALEVE) 220 MG tablet Take 220-440 mg by mouth daily as needed (for pain).    [provider]  traMADol (ULTRAM) 50 MG tablet Take 1 tablet (50 mg total) by mouth every 6 (six) hours as needed. 08/05/17   Gilda Crease, MD    Family History History reviewed. No pertinent family history.  Social History Social History   Tobacco Use  . Smoking status: Never Smoker  .  Smokeless tobacco: Never Used  Substance Use Topics  . Alcohol use: No    Comment: occasional  . Drug use: No     Allergies   Patient has no known allergies.   Review of Systems Review of Systems  Constitutional: Negative for chills and fever.  Musculoskeletal: Positive for arthralgias, joint swelling and myalgias.  Neurological: Negative for weakness and numbness.  All other systems reviewed and are negative.    Physical Exam Updated Vital Signs BP 103/66 (BP Location: Right Arm)   Pulse 74   Temp (!) 97.4 F (36.3 C) (Oral)   Resp 16   Ht 5\' 4"  (1.626 m)   Wt 65.8 kg (145 lb)   SpO2 100%   BMI 24.89 kg/m   Physical Exam  Constitutional: He appears well-developed and well-nourished. No distress.  HENT:  Head: Normocephalic.  Neck: Normal range of motion. Neck supple.  Cardiovascular: Normal rate, regular rhythm and normal heart sounds.  Pulmonary/Chest: Effort normal and breath sounds normal. No respiratory distress. He has no wheezes. He has no rales.  Musculoskeletal:       Feet:  Significant right foot swelling, which includes ankle, dorsal foot, toes.  Tender to palpation over medial foot, over the arch of the foot and just medial and distal to the medial malleolus.  Pain with  range of motion of the ankle, tenderness over the dorsal foot.  Skin: Skin is warm and dry.  Nursing note and vitals reviewed.        ED Treatments / Results  Labs (all labs ordered are listed, but only abnormal results are displayed) Labs Reviewed - No data to display  EKG None  Radiology No results found.  Procedures Procedures (including critical care time)  Medications Ordered in ED Medications - No data to display   Initial Impression / Assessment and Plan / ED Course  I have reviewed the triage vital signs and the nursing notes.  Pertinent labs & imaging results that were available during my care of the patient were reviewed by me and considered in my medical  decision making (see chart for details).     Patient with atraumatic swelling to the right foot. Foot is erythematous, tender, warm, swollen. Will get xray and labs   Xray negative. Discussed with Dr. Ranae PalmsYelverton, concerning for possible infection. Will get MR for further evaluation.  Concerning for possible cellulitis versus deep tissue infection versus tendinitis.  Patient signed [facility] to [facility] pending MRI results.  If negative, okay to discharge home, with ice, elevation, NSAIDs.  Vitals:   08/07/17 1933  BP: 103/66  Pulse: 74  Resp: 16  Temp: (!) 97.4 F (36.3 C)  TempSrc: Oral  SpO2: 100%  Weight: 65.8 kg (145 lb)  Height: 5\' 4"  (1.626 m)     Final Clinical Impressions(s) / ED Diagnoses   Final diagnoses:  None    ED Discharge Orders    None       Jaynie CrumbleKirichenko, Mehtab Dolberry, PA-C 08/08/17 0113

## 2017-08-07 NOTE — ED Notes (Signed)
Pt updated on delay, verbalized understanding.

## 2017-08-07 NOTE — ED Notes (Addendum)
FT patient.  See PA assessment.

## 2017-08-07 NOTE — ED Triage Notes (Signed)
Pt reports R ankle swelling onset this week, denies trauma or injury that he can recall. +2 swelling noted to R ankle. Already has ace wrap and crutches. [facility]

## 2017-08-08 ENCOUNTER — Emergency Department (HOSPITAL_COMMUNITY): Payer: Managed Care, Other (non HMO)

## 2017-08-08 MED ORDER — MELOXICAM 15 MG PO TABS: 15.0000 mg | ORAL_TABLET | Freq: Every day | ORAL | 0 refills | Status: DC

## 2017-08-08 NOTE — ED Notes (Signed)
Pt to MRI

## 2017-08-08 NOTE — ED Provider Notes (Signed)
  6:52 AM MRI of foot completed but no report available thus far.  Morning team to follow-up on results and disposition.   Garlon HatchetSanders, Zamarah Ullmer M, PA-C 08/08/17 [phone]    Geoffery Lyonselo, Douglas, MD 08/08/17 [phone]

## 2017-08-08 NOTE — ED Notes (Signed)
Family member at bedside given recliner and warm blankets.

## 2017-08-08 NOTE — Progress Notes (Signed)
Orthopedic Tech Progress Note Patient Details:  Tyler Chang [birth year] [id number]  Ortho Devices Type of Ortho Device: CAM walker Ortho Device/Splint Location: rle Ortho Device/Splint Interventions: Application   Post Interventions Patient Tolerated: Well Instructions Provided: Care of device   Nikki DomCrawford, Jenaro Souder 08/08/2017, 8:08 AM

## 2017-08-08 NOTE — ED Provider Notes (Signed)
0719 BP 120/68 (BP Location: Right Arm)   Pulse 66   Temp (!) 97.4 F (36.3 C) (Oral)   Resp 16   Ht 5\' 4"  (1.626 m)   Wt 65.8 kg (145 lb)   SpO2 98%   BMI 24.89 kg/m  Patient double sign out for right foot and ankle pain.  He has not had any injuries.  MRI shows fluid accumulation around the Post tibialis sheath and associated soft tissue swelling. Patient is very flat-footed and I suspect this is secondary to long-term pronation.  There is no fluid within the joint so I have no concern for gout or septic joint.  Patient is only minimally tender to palpation however point tender along the inferomedial insertion site of the tendon.  Patient Tyler Chang has crutches and is placed in cam walker boot.  I have switched him from ibuprofen to Mobic.  He may take this once daily.  We discussed supportive care including icing the foot and follow-up with either orthopedist's or podiatry.  Patient does appear to have some potential for tinea pedis given the scaling and peeling in the moccasin distribution along with thickened and extremely overgrown nails. Patient does not appear to have any emergent cause of his ankle pain and swelling and appears appropriate for discharge.      Arthor CaptainHarris, Rithik Odea, PA-C 08/08/17 0830    Charlynne PanderYao, David Hsienta, MD 08/09/17 [phone]

## 2017-08-08 NOTE — Discharge Instructions (Addendum)
Return to the emergency department for the following reasons: Your foot, leg, toes, or ankle tingles or becomes numb. Your foot, leg, toes, or ankle becomes swollen. Your foot, leg, toes, or ankle turns pale or blue.

## 2017-08-10 ENCOUNTER — Encounter: Payer: Managed Care, Other (non HMO) | Admitting: Podiatry

## 2017-08-11 ENCOUNTER — Ambulatory Visit: Payer: Managed Care, Other (non HMO) | Admitting: Podiatry

## 2017-08-11 ENCOUNTER — Encounter: Payer: Self-pay | Admitting: Podiatry

## 2017-08-11 VITALS — BP 164/112 | HR 73 | Resp 16

## 2017-08-11 DIAGNOSIS — M779 Enthesopathy, unspecified: Secondary | ICD-10-CM

## 2017-08-11 MED ORDER — TRIAMCINOLONE ACETONIDE 10 MG/ML IJ SUSP
10.0000 mg | Freq: Once | INTRAMUSCULAR | Status: AC
  Administered 2017-08-11: 10 mg

## 2017-08-11 NOTE — Progress Notes (Signed)
Subjective:   Patient ID: Tyler BustardBobby L Taketa, male   DOB: 57 y.o.   MRN: [id number]   HPI Patient presents stating his had a lot of pain on the inside of his right ankle and he thinks that maybe he did some to it he had an MRI x-rays and is wearing a boot currently but is still very sore.  Does not remember specific injury and its been going on for several weeks   Review of Systems  All other systems reviewed and are negative.       Objective:  Physical Exam  Constitutional: He appears well-developed and well-nourished.  Cardiovascular: Intact distal pulses.  Pulmonary/Chest: Effort normal.  Musculoskeletal: Normal range of motion.  Neurological: He is alert.  Skin: Skin is warm.  Nursing note and vitals reviewed.   Neurovascular status intact muscle strength adequate range of motion within normal limits with patient found to have exquisite discomfort in the posterior tibial tendon near the insertion navicular right with patient taking Mobic for it currently.  MRIs indicate inflammation but no indications of tear and it is quite sore when palpated into the area.  Patient has good digital perfusion and is well oriented x3     Assessment:  Acute posterior tibial tendinitis right with inflammation fluid buildup     Plan:  H&P condition reviewed and today careful sheath injection administered 3 mg Kenalog 5 mg Xylocaine and placed into a fascial brace to lift up the arch.  May return to work if symptoms are under control seen back to recheck in 1 week

## 2017-08-11 NOTE — Progress Notes (Signed)
   Subjective:    Patient ID: Tyler Chang, male    DOB: [birth year], 57 y.o.   MRN: [id number]  HPI    Review of Systems  All other systems reviewed and are negative.      Objective:   Physical Exam        Assessment & Plan:

## 2017-08-18 ENCOUNTER — Ambulatory Visit: Payer: Managed Care, Other (non HMO) | Admitting: Podiatry

## 2017-09-01 ENCOUNTER — Ambulatory Visit: Payer: Managed Care, Other (non HMO) | Admitting: Podiatry

## 2017-10-03 NOTE — Progress Notes (Signed)
This encounter was created in error - please disregard.

## 2018-12-26 ENCOUNTER — Encounter (HOSPITAL_COMMUNITY): Payer: Self-pay | Admitting: Emergency Medicine

## 2018-12-26 ENCOUNTER — Observation Stay (HOSPITAL_COMMUNITY)
Admission: EM | Admit: 2018-12-26 | Discharge: 2018-12-27 | Disposition: A | Discharge status: Home / Self Care | Payer: Managed Care, Other (non HMO) | Attending: General Surgery | Admitting: General Surgery

## 2018-12-26 ENCOUNTER — Emergency Department (HOSPITAL_COMMUNITY): Payer: Managed Care, Other (non HMO)

## 2018-12-26 ENCOUNTER — Other Ambulatory Visit: Payer: Self-pay

## 2018-12-26 DIAGNOSIS — Z20828 Contact with and (suspected) exposure to other viral communicable diseases: Secondary | ICD-10-CM | POA: Diagnosis not present

## 2018-12-26 DIAGNOSIS — R1031 Right lower quadrant pain: Secondary | ICD-10-CM

## 2018-12-26 DIAGNOSIS — K3580 Unspecified acute appendicitis: Principal | ICD-10-CM | POA: Insufficient documentation

## 2018-12-26 DIAGNOSIS — K4090 Unilateral inguinal hernia, without obstruction or gangrene, not specified as recurrent: Secondary | ICD-10-CM | POA: Insufficient documentation

## 2018-12-26 LAB — CBC
HCT: 35.3 % — ABNORMAL LOW (ref 39.0–52.0)
Hemoglobin: 11.3 g/dL — ABNORMAL LOW (ref 13.0–17.0)
MCH: 29.7 pg (ref 26.0–34.0)
MCHC: 32 g/dL (ref 30.0–36.0)
MCV: 92.9 fL (ref 80.0–100.0)
Platelets: 249 10*3/uL (ref 150–400)
RBC: 3.8 MIL/uL — ABNORMAL LOW (ref 4.22–5.81)
RDW: 13.4 % (ref 11.5–15.5)
WBC: 8.6 10*3/uL (ref 4.0–10.5)
nRBC: 0 % (ref 0.0–0.2)

## 2018-12-26 LAB — COMPREHENSIVE METABOLIC PANEL
ALT: 34 U/L (ref 0–44)
AST: 30 U/L (ref 15–41)
Albumin: 3.9 g/dL (ref 3.5–5.0)
Alkaline Phosphatase: 78 U/L (ref 38–126)
Anion gap: 7 (ref 5–15)
BUN: 18 mg/dL (ref 6–20)
CO2: 25 mmol/L (ref 22–32)
Calcium: 8.8 mg/dL — ABNORMAL LOW (ref 8.9–10.3)
Chloride: 106 mmol/L (ref 98–111)
Creatinine, Ser: 0.99 mg/dL (ref 0.61–1.24)
GFR calc Af Amer: >60 mL/min (ref >60)
GFR calc non Af Amer: >60 mL/min (ref >60)
Glucose, Bld: 78 mg/dL (ref 70–99)
Potassium: 3.5 mmol/L (ref 3.5–5.1)
Sodium: 138 mmol/L (ref 135–145)
Total Bilirubin: 0.4 mg/dL (ref 0.3–1.2)
Total Protein: 6.9 g/dL (ref 6.5–8.1)

## 2018-12-26 LAB — HIV ANTIBODY (ROUTINE TESTING W REFLEX): HIV Screen 4th Generation wRfx: NONREACTIVE

## 2018-12-26 LAB — URINALYSIS, ROUTINE W REFLEX MICROSCOPIC
Bilirubin Urine: NEGATIVE
Glucose, UA: NEGATIVE mg/dL
Hgb urine dipstick: NEGATIVE
Ketones, ur: NEGATIVE mg/dL
Leukocytes,Ua: NEGATIVE
Nitrite: NEGATIVE
Protein, ur: NEGATIVE mg/dL
Specific Gravity, Urine: 1.029 (ref 1.005–1.030)
pH: 6 (ref 5.0–8.0)

## 2018-12-26 LAB — SARS CORONAVIRUS 2 (TAT 6-24 HRS): SARS Coronavirus 2: NEGATIVE

## 2018-12-26 LAB — LIPASE, BLOOD: Lipase: 36 U/L (ref 11–51)

## 2018-12-26 MED ORDER — MORPHINE SULFATE (PF) 4 MG/ML IV SOLN
4.0000 mg | Freq: Once | INTRAVENOUS | Status: AC
  Administered 2018-12-26: 4 mg via INTRAVENOUS
  Filled 2018-12-26: qty 1

## 2018-12-26 MED ORDER — SODIUM CHLORIDE 0.9 % IV BOLUS
1000.0000 mL | Freq: Once | INTRAVENOUS | Status: AC
  Administered 2018-12-26: 1000 mL via INTRAVENOUS

## 2018-12-26 MED ORDER — IOHEXOL 300 MG/ML  SOLN
100.0000 mL | Freq: Once | INTRAMUSCULAR | Status: AC | PRN
  Administered 2018-12-26: 100 mL via INTRAVENOUS

## 2018-12-26 MED ORDER — ALUM & MAG HYDROXIDE-SIMETH 200-200-20 MG/5ML PO SUSP: 30.0000 mL | Freq: Four times a day (QID) | ORAL | Status: DC | PRN

## 2018-12-26 MED ORDER — PIPERACILLIN-TAZOBACTAM 3.375 G IVPB 30 MIN
3.3750 g | Freq: Once | INTRAVENOUS | Status: AC
  Administered 2018-12-26: 3.375 g via INTRAVENOUS
  Filled 2018-12-26: qty 50

## 2018-12-26 MED ORDER — SODIUM CHLORIDE 0.9 % IV SOLN
INTRAVENOUS | Status: DC | PRN
  Administered 2018-12-26: 10:00:00 via INTRAVENOUS

## 2018-12-26 MED ORDER — PIPERACILLIN-TAZOBACTAM 3.375 G IVPB
3.3750 g | Freq: Three times a day (TID) | INTRAVENOUS | Status: DC
  Administered 2018-12-26 – 2018-12-27 (×3): 3.375 g via INTRAVENOUS
  Filled 2018-12-26 (×3): qty 50

## 2018-12-26 MED ORDER — ACETAMINOPHEN 650 MG RE SUPP: 650.0000 mg | Freq: Four times a day (QID) | RECTAL | Status: DC | PRN

## 2018-12-26 MED ORDER — ONDANSETRON HCL 4 MG/2ML IJ SOLN: 4.0000 mg | Freq: Four times a day (QID) | INTRAMUSCULAR | Status: DC | PRN

## 2018-12-26 MED ORDER — HYDROMORPHONE HCL 1 MG/ML IJ SOLN: 1.0000 mg | INTRAMUSCULAR | Status: DC | PRN

## 2018-12-26 MED ORDER — ACETAMINOPHEN 325 MG PO TABS: 650.0000 mg | ORAL_TABLET | Freq: Four times a day (QID) | ORAL | Status: DC | PRN

## 2018-12-26 MED ORDER — LACTATED RINGERS IV SOLN
INTRAVENOUS | Status: DC
  Administered 2018-12-26 – 2018-12-27 (×2): via INTRAVENOUS

## 2018-12-26 MED ORDER — HYDROCODONE-ACETAMINOPHEN 5-325 MG PO TABS: 1.0000 | ORAL_TABLET | ORAL | Status: DC | PRN

## 2018-12-26 MED ORDER — ENOXAPARIN SODIUM 40 MG/0.4ML ~~LOC~~ SOLN
40.0000 mg | SUBCUTANEOUS | Status: DC
  Filled 2018-12-26: qty 0.4

## 2018-12-26 MED ORDER — SIMETHICONE 80 MG PO CHEW: 40.0000 mg | CHEWABLE_TABLET | Freq: Four times a day (QID) | ORAL | Status: DC | PRN

## 2018-12-26 MED ORDER — ONDANSETRON 4 MG PO TBDP: 4.0000 mg | ORAL_TABLET | Freq: Four times a day (QID) | ORAL | Status: DC | PRN

## 2018-12-26 MED ORDER — ONDANSETRON HCL 4 MG/2ML IJ SOLN
4.0000 mg | Freq: Once | INTRAMUSCULAR | Status: AC
  Administered 2018-12-26: 4 mg via INTRAVENOUS
  Filled 2018-12-26: qty 2

## 2018-12-26 NOTE — Progress Notes (Signed)
Pt rates pain at 8/10. However he refuses his pain medication of any kind at this time. Told to please request his pain medication if he feels he needs it at anytime.

## 2018-12-26 NOTE — ED Provider Notes (Addendum)
[facility] EMERGENCY DEPARTMENT Provider Note   CSN: [id number] Arrival date & time: 12/26/18  0100     History   Chief Complaint Chief Complaint  Patient presents with  . Abdominal Pain    HPI SEYED HEFFLEY is a 58 y.o. male.     Patient c/o right mid abdominal pain for past day. Symptoms acute onset, moderate, persistent, constant, non radiating. No hx same pain. No vomiting or diarrhea. Sl decreased appetite today. No dysuria or hematuria. Denies hx kidney stones or gallstones. Denies prior abd surgery. No abd distension, had normal bm today. No scrotal or testicular pain. Denies cough or uri symptoms. No chest pain or sob.   The history is provided by the patient.  Abdominal Pain Associated symptoms: no chest pain, no chills, no cough, no diarrhea, no dysuria, no fever, no shortness of breath, no sore throat and no vomiting     Past Medical History:  Diagnosis Date  . Asthma     Patient Active Problem List   Diagnosis Date Noted  . FATIGUE 05/02/2009  . ELEVATED BLOOD PRESSURE WITHOUT DIAGNOSIS OF HYPERTENSION 05/02/2009    Past Surgical History:  Procedure Laterality Date  . ORTHOPEDIC SURGERY          Home Medications    Prior to Admission medications   Medication Sig Start Date End Date Taking? Authorizing Provider  meloxicam (MOBIC) 15 MG tablet Take 1 tablet (15 mg total) by mouth daily. 08/08/17   Harris, Cammy Copa, PA-C  traMADol (ULTRAM) 50 MG tablet Take 1 tablet (50 mg total) by mouth every 6 (six) hours as needed. 08/05/17   Gilda Crease, MD    Family History History reviewed. No pertinent family history.  Social History Social History   Tobacco Use  . Smoking status: Never Smoker  . Smokeless tobacco: Never Used  Substance Use Topics  . Alcohol use: No    Comment: occasional  . Drug use: No     Allergies   Patient has no known allergies.   Review of Systems Review of Systems  Constitutional: Negative for chills and  fever.  HENT: Negative for sore throat.   Eyes: Negative for redness.  Respiratory: Negative for cough and shortness of breath.   Cardiovascular: Negative for chest pain.  Gastrointestinal: Positive for abdominal pain. Negative for diarrhea and vomiting.  Endocrine: Negative for polyuria.  Genitourinary: Negative for dysuria and flank pain.  Musculoskeletal: Negative for back pain and neck pain.  Skin: Negative for rash.  Neurological: Negative for headaches.  Hematological: Does not bruise/bleed easily.  Psychiatric/Behavioral: Negative for confusion.     Physical Exam Updated Vital Signs BP 120/81   Pulse 76   Temp 97.6 F (36.4 C) (Oral)   Resp 20   Ht 1.626 m (5\' 4" )   Wt 68.9 kg   SpO2 100%   BMI 26.09 kg/m   Physical Exam Vitals signs and nursing note reviewed.  Constitutional:      Appearance: Normal appearance. He is well-developed.  HENT:     Head: Atraumatic.     Nose: Nose normal.     Mouth/Throat:     Mouth: Mucous membranes are moist.     Pharynx: Oropharynx is clear.  Eyes:     General: No scleral icterus.    Conjunctiva/sclera: Conjunctivae normal.  Neck:     Musculoskeletal: Normal range of motion and neck supple. No neck rigidity.     Trachea: No tracheal deviation.  Cardiovascular:  Rate and Rhythm: Normal rate and regular rhythm.     Pulses: Normal pulses.     Heart sounds: Normal heart sounds. No murmur. No friction rub. No gallop.   Pulmonary:     Effort: Pulmonary effort is normal. No accessory muscle usage or respiratory distress.     Breath sounds: Normal breath sounds.  Abdominal:     General: Bowel sounds are normal. There is no distension.     Palpations: Abdomen is soft.     Tenderness: There is abdominal tenderness. There is no guarding.     Comments: Right sided abdominal tenderness.  Non tender, reduced, left inguinal hernia.   Genitourinary:    Comments: No cva tenderness. Musculoskeletal:        General: No swelling or  tenderness.  Skin:    General: Skin is warm and dry.     Findings: No rash.  Neurological:     Mental Status: He is alert.     Comments: Alert, speech clear.   Psychiatric:        Mood and Affect: Mood normal.      ED Treatments / Results  Labs (all labs ordered are listed, but only abnormal results are displayed) Results for orders placed or performed during the hospital encounter of 12/26/18  CBC  Result Value Ref Range   WBC 8.6 4.0 - 10.5 K/uL   RBC 3.80 (L) 4.22 - 5.81 MIL/uL   Hemoglobin 11.3 (L) 13.0 - 17.0 g/dL   HCT 40.935.3 (L) 81.139.0 - 91.452.0 %   MCV 92.9 80.0 - 100.0 fL   MCH 29.7 26.0 - 34.0 pg   MCHC 32.0 30.0 - 36.0 g/dL   RDW 78.213.4 95.611.5 - 21.315.5 %   Platelets 249 150 - 400 K/uL   nRBC 0.0 0.0 - 0.2 %  CMET  Result Value Ref Range   Sodium 138 135 - 145 mmol/L   Potassium 3.5 3.5 - 5.1 mmol/L   Chloride 106 98 - 111 mmol/L   CO2 25 22 - 32 mmol/L   Glucose, Bld 78 70 - 99 mg/dL   BUN 18 6 - 20 mg/dL   Creatinine, Ser 0.860.99 0.61 - 1.24 mg/dL   Calcium 8.8 (L) 8.9 - 10.3 mg/dL   Total Protein 6.9 6.5 - 8.1 g/dL   Albumin 3.9 3.5 - 5.0 g/dL   AST 30 15 - 41 U/L   ALT 34 0 - 44 U/L   Alkaline Phosphatase 78 38 - 126 U/L   Total Bilirubin 0.4 0.3 - 1.2 mg/dL   GFR calc non Af Amer >60 >60 mL/min   GFR calc Af Amer >60 >60 mL/min   Anion gap 7 5 - 15  Lipase  Result Value Ref Range   Lipase 36 11 - 51 U/L  UA  Result Value Ref Range   Color, Urine YELLOW YELLOW   APPearance CLEAR CLEAR   Specific Gravity, Urine 1.029 1.005 - 1.030   pH 6.0 5.0 - 8.0   Glucose, UA NEGATIVE NEGATIVE mg/dL   Hgb urine dipstick NEGATIVE NEGATIVE   Bilirubin Urine NEGATIVE NEGATIVE   Ketones, ur NEGATIVE NEGATIVE mg/dL   Protein, ur NEGATIVE NEGATIVE mg/dL   Nitrite NEGATIVE NEGATIVE   Leukocytes,Ua NEGATIVE NEGATIVE    EKG None  Radiology Ct Abdomen Pelvis W Contrast  Result Date: 12/26/2018 CLINICAL DATA:  Right upper abdominal pain EXAM: CT ABDOMEN AND PELVIS WITH  CONTRAST TECHNIQUE: Multidetector CT imaging of the abdomen and pelvis was performed using the  standard protocol following bolus administration of intravenous contrast. CONTRAST:  176mL OMNIPAQUE IOHEXOL 300 MG/ML  SOLN COMPARISON:  None. FINDINGS: Lower chest: No acute abnormality. Hepatobiliary: No focal hepatic abnormality. Gallbladder unremarkable. Pancreas: No focal abnormality or ductal dilatation. Spleen: No focal abnormality.  Normal size. Adrenals/Urinary Tract: No adrenal abnormality. No focal renal abnormality. No stones or hydronephrosis. Urinary bladder is unremarkable. Stomach/Bowel: The appendix is distended, measuring up to 18 mm in greatest diameter. This is in the midportion of the appendix. The proximal and distal appendix are decompressed with the midportion distended with stool. There is no surrounding inflammatory change. Stomach, large and small bowel grossly unremarkable. Vascular/Lymphatic: No evidence of aneurysm or adenopathy. Reproductive: No visible focal abnormality. Other: No free fluid or free air. Left inguinal hernia containing fat and a knuckle of small bowel. No bowel obstruction. 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 no acute bony abnormality. IMPRESSION: Unusual appearance of the appendix which is decompressed proximally and distally, distended in the midportion measuring up to 18 mm, containing stool. There is no surrounding inflammatory change. I doubt acute appendicitis, but recommend clinical correlation for right lower quadrant pain. Left inguinal hernia containing fat and a knuckle of small bowel. No obstruction. Electronically Signed   By: Rolm Baptise M.D.   On: 12/26/2018 03:53    Procedures Procedures (including critical care time)  Medications Ordered in ED Medications  sodium chloride 0.9 % bolus 1,000 mL (has no administration in time range)  morphine 4 MG/ML injection 4 mg (has no administration in time range)   ondansetron (ZOFRAN) injection 4 mg (has no administration in time range)     Initial Impression / Assessment and Plan / ED Course  I have reviewed the triage vital signs and the nursing notes.  Pertinent labs & imaging results that were available during my care of the patient were reviewed by me and considered in my medical decision making (see chart for details).  Iv ns. Labs. Morphine iv. zofran iv.   Reviewed nursing notes and prior charts for additional history.   Labs reviewed/interpreted by me - chem normal. ua neg.   Pain/tenderness persists - will get ct imaging.  CT reviewed/interpreted by me - ?distended mid appendix.  Pt does have persistent mid to lower right-sided tenderness, guards - will consult general surgeon re possible atypical ct appearance of early appendicitis.   0640 discussed patient and ct with Dr Arnoldo Morale, on call for general surgery - he will see in ED this AM.   Patient informed of plan.   Zosyn iv.   Dr Arnoldo Morale has seen in ED, and indicates he plans to admit patient for definitive tx.     Final Clinical Impressions(s) / ED Diagnoses   Final diagnoses:  None    ED Discharge Orders    None             Lajean Saver, MD 12/26/18 [phone]

## 2018-12-26 NOTE — H&P (Signed)
Tyler Chang is an 58 y.o. male.   Chief Complaint: Right sided abdominal pain HPI: Patient is a 58 year old black male who started having right-sided abdominal pain proximately 3 days ago.  He denies any fever, chills, diarrhea, constipation.  The pain seems to wax and wane.  He states he came to the emergency room because the pain would not go away.  He has never had this type of pain.  A CT scan of the abdomen was performed which revealed dilatation of the midportion of the appendix, though no evidence of acute appendicitis was seen.  Patient states his pain is 4 out of 10.  Patient has never had a colonoscopy.  Past Medical History:  Diagnosis Date  . Asthma     Past Surgical History:  Procedure Laterality Date  . ORTHOPEDIC SURGERY      History reviewed. No pertinent family history. Social History:  reports that he has never smoked. He has never used smokeless tobacco. He reports that he does not drink alcohol or use drugs.  Allergies: No Known Allergies  (Not in a hospital admission)   Results for orders placed or performed during the hospital encounter of 12/26/18 (from the past 48 hour(s))  CBC     Status: Abnormal   Collection Time: 12/26/18  2:05 AM  Result Value Ref Range   WBC 8.6 4.0 - 10.5 K/uL   RBC 3.80 (L) 4.22 - 5.81 MIL/uL   Hemoglobin 11.3 (L) 13.0 - 17.0 g/dL   HCT 35.3 (L) 39.0 - 52.0 %   MCV 92.9 80.0 - 100.0 fL   MCH 29.7 26.0 - 34.0 pg   MCHC 32.0 30.0 - 36.0 g/dL   RDW 13.4 11.5 - 15.5 %   Platelets 249 150 - 400 K/uL   nRBC 0.0 0.0 - 0.2 %    Comment: Performed at [facility], [address], [place], [place] [address]  CMET     Status: Abnormal   Collection Time: 12/26/18  2:05 AM  Result Value Ref Range   Sodium 138 135 - 145 mmol/L   Potassium 3.5 3.5 - 5.1 mmol/L   Chloride 106 98 - 111 mmol/L   CO2 25 22 - 32 mmol/L   Glucose, Bld 78 70 - 99 mg/dL   BUN 18 6 - 20 mg/dL   Creatinine, Ser 0.99 0.61 - 1.24 mg/dL   Calcium 8.8 (L) 8.9 -  10.3 mg/dL   Total Protein 6.9 6.5 - 8.1 g/dL   Albumin 3.9 3.5 - 5.0 g/dL   AST 30 15 - 41 U/L   ALT 34 0 - 44 U/L   Alkaline Phosphatase 78 38 - 126 U/L   Total Bilirubin 0.4 0.3 - 1.2 mg/dL   GFR calc non Af Amer >60 >60 mL/min   GFR calc Af Amer >60 >60 mL/min   Anion gap 7 5 - 15    Comment: Performed at [facility], [address], [place], [place] [address]  Lipase     Status: None   Collection Time: 12/26/18  2:05 AM  Result Value Ref Range   Lipase 36 11 - 51 U/L    Comment: Performed at [facility], [address] [place], [place] [address]  UA     Status: None   Collection Time: 12/26/18  2:30 AM  Result Value Ref Range   Color, Urine YELLOW YELLOW   APPearance CLEAR CLEAR   Specific Gravity, Urine 1.029 1.005 - 1.030   pH 6.0 5.0 -  8.0   Glucose, UA NEGATIVE NEGATIVE mg/dL   Hgb urine dipstick NEGATIVE NEGATIVE   Bilirubin Urine NEGATIVE NEGATIVE   Ketones, ur NEGATIVE NEGATIVE mg/dL   Protein, ur NEGATIVE NEGATIVE mg/dL   Nitrite NEGATIVE NEGATIVE   Leukocytes,Ua NEGATIVE NEGATIVE    Comment: Performed at [facility], [address], [place], [place] [address]   Ct Abdomen Pelvis W Contrast  Result Date: 12/26/2018 CLINICAL DATA:  Right upper abdominal pain EXAM: CT ABDOMEN AND PELVIS WITH CONTRAST TECHNIQUE: Multidetector CT imaging of the abdomen and pelvis was performed using the standard protocol following bolus administration of intravenous contrast. CONTRAST:  OMNIPAQUE IOHEXOL 300 MG/ML  SOLN COMPARISON:  None. FINDINGS: Lower chest: No acute abnormality. Hepatobiliary: No focal hepatic abnormality. Gallbladder unremarkable. Pancreas: No focal abnormality or ductal dilatation. Spleen: No focal abnormality.  Normal size. Adrenals/Urinary Tract: No adrenal abnormality. No focal renal abnormality. No stones or hydronephrosis. Urinary bladder is unremarkable. Stomach/Bowel: The appendix is distended, measuring up to 18 mm in greatest diameter. This is in the  midportion of the appendix. The proximal and distal appendix are decompressed with the midportion distended with stool. There is no surrounding inflammatory change. Stomach, large and small bowel grossly unremarkable. Vascular/Lymphatic: No evidence of aneurysm or adenopathy. Reproductive: No visible focal abnormality. Other: No free fluid or free air. Left inguinal hernia containing fat and a knuckle of small bowel. No bowel obstruction. 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 no acute bony abnormality. IMPRESSION: Unusual appearance of the appendix which is decompressed proximally and distally, distended in the midportion measuring up to 18 mm, containing stool. There is no surrounding inflammatory change. I doubt acute appendicitis, but recommend clinical correlation for right lower quadrant pain. Left inguinal hernia containing fat and a knuckle of small bowel. No obstruction. Electronically Signed   By: Charlett Nose M.D.   On: 12/26/2018 03:53    Review of Systems  Constitutional: Positive for malaise/fatigue.  HENT: Negative.   Eyes: Negative.   Respiratory: Negative.   Cardiovascular: Negative.   Gastrointestinal: Positive for abdominal pain. Negative for nausea.  Genitourinary: Negative.   Musculoskeletal: Negative.   Skin: Negative.   Neurological: Negative.   Endo/Heme/Allergies: Negative.   Psychiatric/Behavioral: Negative.     Blood pressure 120/81, pulse 76, temperature 97.6 F (36.4 C), temperature source Oral, resp. rate 20, height 5\' 4"  (1.626 m), weight 68.9 kg, SpO2 100 %. Physical Exam  Vitals reviewed. Constitutional: He is oriented to person, place, and time. He appears well-developed and well-nourished. No distress.  HENT:  Head: Normocephalic and atraumatic.  Cardiovascular: Normal rate, regular rhythm and normal heart sounds. Exam reveals no gallop and no friction rub.  No murmur heard. Respiratory: Effort normal and breath sounds  normal. No respiratory distress. He has no wheezes. He has no rales.  GI: Soft. Bowel sounds are normal. He exhibits no distension. There is abdominal tenderness. There is no rebound and no guarding.  Tender to palpation above McBurney's point.  No rigidity is noted.  Neurological: He is alert and oriented to person, place, and time.  Skin: Skin is warm and dry.    CT scan images personally reviewed Assessment/Plan Impression: Right-sided abdominal pain, possible early appendicitis Plan: Will admit for observation.  Should his pain not resolve in the next 24 hours, will proceed with laparoscopic appendectomy.  Will start antibiotics empirically.  Patient understands and agrees to the treatment plan.  , MD 12/26/2018, 8:29 AM

## 2018-12-26 NOTE — ED Triage Notes (Addendum)
Pt c/o right upper abd pain x one day.

## 2018-12-27 ENCOUNTER — Observation Stay (HOSPITAL_COMMUNITY): Payer: Managed Care, Other (non HMO) | Admitting: Anesthesiology

## 2018-12-27 ENCOUNTER — Other Ambulatory Visit: Payer: Self-pay

## 2018-12-27 ENCOUNTER — Encounter (HOSPITAL_COMMUNITY)
Admission: EM | Disposition: A | Discharge status: Home / Self Care | Payer: Self-pay | Source: Home / Self Care | Attending: Emergency Medicine

## 2018-12-27 ENCOUNTER — Encounter (HOSPITAL_COMMUNITY): Payer: Self-pay

## 2018-12-27 DIAGNOSIS — K3580 Unspecified acute appendicitis: Secondary | ICD-10-CM | POA: Diagnosis not present

## 2018-12-27 DIAGNOSIS — R1031 Right lower quadrant pain: Secondary | ICD-10-CM | POA: Diagnosis not present

## 2018-12-27 HISTORY — PX: LAPAROSCOPIC APPENDECTOMY: SHX408

## 2018-12-27 LAB — BASIC METABOLIC PANEL
Anion gap: 7 (ref 5–15)
BUN: 12 mg/dL (ref 6–20)
CO2: 25 mmol/L (ref 22–32)
Calcium: 8.8 mg/dL — ABNORMAL LOW (ref 8.9–10.3)
Chloride: 107 mmol/L (ref 98–111)
Creatinine, Ser: 0.97 mg/dL (ref 0.61–1.24)
GFR calc Af Amer: >60 mL/min (ref >60)
GFR calc non Af Amer: >60 mL/min (ref >60)
Glucose, Bld: 107 mg/dL — ABNORMAL HIGH (ref 70–99)
Potassium: 3.9 mmol/L (ref 3.5–5.1)
Sodium: 139 mmol/L (ref 135–145)

## 2018-12-27 LAB — CBC
HCT: 35.3 % — ABNORMAL LOW (ref 39.0–52.0)
Hemoglobin: 11.4 g/dL — ABNORMAL LOW (ref 13.0–17.0)
MCH: 29.8 pg (ref 26.0–34.0)
MCHC: 32.3 g/dL (ref 30.0–36.0)
MCV: 92.4 fL (ref 80.0–100.0)
Platelets: 246 10*3/uL (ref 150–400)
RBC: 3.82 MIL/uL — ABNORMAL LOW (ref 4.22–5.81)
RDW: 13.3 % (ref 11.5–15.5)
WBC: 7 10*3/uL (ref 4.0–10.5)
nRBC: 0 % (ref 0.0–0.2)

## 2018-12-27 SURGERY — APPENDECTOMY, LAPAROSCOPIC
Anesthesia: General

## 2018-12-27 MED ORDER — SUGAMMADEX SODIUM 200 MG/2ML IV SOLN
INTRAVENOUS | Status: DC | PRN
  Administered 2018-12-27: 200 mg via INTRAVENOUS

## 2018-12-27 MED ORDER — PROPOFOL 10 MG/ML IV BOLUS
INTRAVENOUS | Status: DC | PRN
  Administered 2018-12-27: 150 mg via INTRAVENOUS

## 2018-12-27 MED ORDER — MIDAZOLAM HCL 2 MG/2ML IJ SOLN: 0.5000 mg | Freq: Once | INTRAMUSCULAR | Status: DC | PRN

## 2018-12-27 MED ORDER — CHLORHEXIDINE GLUCONATE CLOTH 2 % EX PADS: 6.0000 | MEDICATED_PAD | Freq: Once | CUTANEOUS | Status: DC

## 2018-12-27 MED ORDER — LACTATED RINGERS IV SOLN
INTRAVENOUS | Status: DC
  Administered 2018-12-27: 11:00:00 via INTRAVENOUS

## 2018-12-27 MED ORDER — PHENYLEPHRINE 40 MCG/ML (10ML) SYRINGE FOR IV PUSH (FOR BLOOD PRESSURE SUPPORT)
PREFILLED_SYRINGE | INTRAVENOUS | Status: AC
  Filled 2018-12-27: qty 10

## 2018-12-27 MED ORDER — EPHEDRINE SULFATE 50 MG/ML IJ SOLN
INTRAMUSCULAR | Status: DC | PRN
  Administered 2018-12-27: 10 mg via INTRAVENOUS

## 2018-12-27 MED ORDER — FENTANYL CITRATE (PF) 250 MCG/5ML IJ SOLN
INTRAMUSCULAR | Status: AC
  Filled 2018-12-27: qty 5

## 2018-12-27 MED ORDER — HYDROMORPHONE HCL 1 MG/ML IJ SOLN: 0.2500 mg | INTRAMUSCULAR | Status: DC | PRN

## 2018-12-27 MED ORDER — BUPIVACAINE LIPOSOME 1.3 % IJ SUSP
INTRAMUSCULAR | Status: DC | PRN
  Administered 2018-12-27: 20 mL

## 2018-12-27 MED ORDER — ROCURONIUM BROMIDE 10 MG/ML (PF) SYRINGE
PREFILLED_SYRINGE | INTRAVENOUS | Status: AC
  Filled 2018-12-27: qty 10

## 2018-12-27 MED ORDER — ONDANSETRON HCL 4 MG/2ML IJ SOLN
INTRAMUSCULAR | Status: DC | PRN
  Administered 2018-12-27: 4 mg via INTRAVENOUS

## 2018-12-27 MED ORDER — ROCURONIUM 10MG/ML (10ML) SYRINGE FOR MEDFUSION PUMP - OPTIME
INTRAVENOUS | Status: DC | PRN
  Administered 2018-12-27: 30 mg via INTRAVENOUS

## 2018-12-27 MED ORDER — LIDOCAINE HCL (CARDIAC) PF 50 MG/5ML IV SOSY
PREFILLED_SYRINGE | INTRAVENOUS | Status: DC | PRN
  Administered 2018-12-27: 50 mg via INTRAVENOUS

## 2018-12-27 MED ORDER — EPHEDRINE 5 MG/ML INJ
INTRAVENOUS | Status: AC
  Filled 2018-12-27: qty 10

## 2018-12-27 MED ORDER — SUCCINYLCHOLINE CHLORIDE 200 MG/10ML IV SOSY
PREFILLED_SYRINGE | INTRAVENOUS | Status: AC
  Filled 2018-12-27: qty 10

## 2018-12-27 MED ORDER — FENTANYL CITRATE (PF) 100 MCG/2ML IJ SOLN
INTRAMUSCULAR | Status: DC | PRN
  Administered 2018-12-27 (×3): 50 ug via INTRAVENOUS

## 2018-12-27 MED ORDER — LIDOCAINE 2% (20 MG/ML) 5 ML SYRINGE
INTRAMUSCULAR | Status: AC
  Filled 2018-12-27: qty 5

## 2018-12-27 MED ORDER — KETOROLAC TROMETHAMINE 30 MG/ML IJ SOLN
30.0000 mg | Freq: Once | INTRAMUSCULAR | Status: AC
  Administered 2018-12-27: 30 mg via INTRAVENOUS
  Filled 2018-12-27: qty 1

## 2018-12-27 MED ORDER — EPHEDRINE 5 MG/ML INJ
INTRAVENOUS | Status: AC
  Filled 2018-12-27: qty 20

## 2018-12-27 MED ORDER — SUCCINYLCHOLINE 20MG/ML (10ML) SYRINGE FOR MEDFUSION PUMP - OPTIME
INTRAMUSCULAR | Status: DC | PRN
  Administered 2018-12-27: 100 mg via INTRAVENOUS

## 2018-12-27 MED ORDER — SUGAMMADEX SODIUM 500 MG/5ML IV SOLN
INTRAVENOUS | Status: AC
  Filled 2018-12-27: qty 10

## 2018-12-27 MED ORDER — TRAMADOL HCL 50 MG PO TABS: 50.0000 mg | ORAL_TABLET | Freq: Four times a day (QID) | ORAL | 0 refills | Status: DC | PRN

## 2018-12-27 MED ORDER — PROMETHAZINE HCL 25 MG/ML IJ SOLN: 6.2500 mg | INTRAMUSCULAR | Status: DC | PRN

## 2018-12-27 MED ORDER — ONDANSETRON HCL 4 MG/2ML IJ SOLN
INTRAMUSCULAR | Status: AC
  Filled 2018-12-27: qty 2

## 2018-12-27 MED ORDER — SODIUM CHLORIDE 0.9 % IR SOLN
Status: DC | PRN
  Administered 2018-12-27: 1000 mL

## 2018-12-27 MED ORDER — HYDROCODONE-ACETAMINOPHEN 7.5-325 MG PO TABS: 1.0000 | ORAL_TABLET | Freq: Once | ORAL | Status: DC | PRN

## 2018-12-27 SURGICAL SUPPLY — 57 items
ADH SKN CLS APL DERMABOND .7 (GAUZE/BANDAGES/DRESSINGS) ×1
APL PRP STRL LF DISP 70% ISPRP (MISCELLANEOUS) ×1
BAG RETRIEVAL 10 (BASKET) ×1
BAG RETRIEVAL 10MM (BASKET) ×1
CHLORAPREP W/TINT 26 (MISCELLANEOUS) ×3 IMPLANT
CLOTH BEACON ORANGE TIMEOUT ST (SAFETY) ×3 IMPLANT
COVER LIGHT HANDLE STERIS (MISCELLANEOUS) ×6 IMPLANT
COVER WAND RF STERILE (DRAPES) ×3 IMPLANT
CUTTER FLEX LINEAR 45M (STAPLE) ×3 IMPLANT
DECANTER SPIKE VIAL GLASS SM (MISCELLANEOUS) ×3 IMPLANT
DERMABOND ADVANCED (GAUZE/BANDAGES/DRESSINGS) ×2
DERMABOND ADVANCED .7 DNX12 (GAUZE/BANDAGES/DRESSINGS) ×1 IMPLANT
ELECT REM PT RETURN 9FT ADLT (ELECTROSURGICAL) ×3
ELECTRODE REM PT RTRN 9FT ADLT (ELECTROSURGICAL) ×1 IMPLANT
EVACUATOR SMOKE 8.L (FILTER) ×3 IMPLANT
GLOVE BIOGEL M 6.5 STRL (GLOVE) ×2 IMPLANT
GLOVE BIOGEL PI IND STRL 6.5 (GLOVE) IMPLANT
GLOVE BIOGEL PI IND STRL 7.0 (GLOVE) ×3 IMPLANT
GLOVE BIOGEL PI IND STRL 7.5 (GLOVE) IMPLANT
GLOVE BIOGEL PI INDICATOR 6.5 (GLOVE) ×2
GLOVE BIOGEL PI INDICATOR 7.0 (GLOVE) ×8
GLOVE BIOGEL PI INDICATOR 7.5 (GLOVE) ×2
GLOVE SURG SS PI 7.5 STRL IVOR (GLOVE) ×3 IMPLANT
GOWN STRL REUS W/ TWL XL LVL3 (GOWN DISPOSABLE) ×1 IMPLANT
GOWN STRL REUS W/TWL LRG LVL3 (GOWN DISPOSABLE) ×3 IMPLANT
GOWN STRL REUS W/TWL XL LVL3 (GOWN DISPOSABLE) ×3
INST SET LAPROSCOPIC AP (KITS) ×3 IMPLANT
KIT TURNOVER KIT A (KITS) ×3 IMPLANT
MANIFOLD NEPTUNE II (INSTRUMENTS) ×3 IMPLANT
NDL HYPO 18GX1.5 BLUNT FILL (NEEDLE) ×1 IMPLANT
NDL INSUFFLATION 14GA 120MM (NEEDLE) ×1 IMPLANT
NEEDLE HYPO 18GX1.5 BLUNT FILL (NEEDLE) ×3 IMPLANT
NEEDLE HYPO 22GX1.5 SAFETY (NEEDLE) ×3 IMPLANT
NEEDLE INSUFFLATION 14GA 120MM (NEEDLE) ×3 IMPLANT
NS IRRIG 1000ML POUR BTL (IV SOLUTION) ×3 IMPLANT
PACK LAP CHOLE LZT030E (CUSTOM PROCEDURE TRAY) ×3 IMPLANT
PAD ARMBOARD 7.5X6 YLW CONV (MISCELLANEOUS) ×3 IMPLANT
PENCIL HANDSWITCHING (ELECTRODE) ×3 IMPLANT
RELOAD STAPLE 45 3.5 BLU ETS (ENDOMECHANICALS) IMPLANT
RELOAD STAPLE TA45 3.5 REG BLU (ENDOMECHANICALS) ×3 IMPLANT
SET BASIN LINEN APH (SET/KITS/TRAYS/PACK) ×3 IMPLANT
SET TUBE SMOKE EVAC HIGH FLOW (TUBING) ×3 IMPLANT
SHEARS HARMONIC ACE PLUS 36CM (ENDOMECHANICALS) ×3 IMPLANT
SUT MNCRL AB 4-0 PS2 18 (SUTURE) ×6 IMPLANT
SUT VICRYL 0 UR6 27IN ABS (SUTURE) ×3 IMPLANT
SYR 20ML LL LF (SYRINGE) ×6 IMPLANT
SYS BAG RETRIEVAL 10MM (BASKET) ×1
SYSTEM BAG RETRIEVAL 10MM (BASKET) ×1 IMPLANT
TRAY FOLEY W/BAG SLVR 16FR (SET/KITS/TRAYS/PACK) ×3
TRAY FOLEY W/BAG SLVR 16FR ST (SET/KITS/TRAYS/PACK) ×1 IMPLANT
TROCAR ENDO BLADELESS 11MM (ENDOMECHANICALS) ×3 IMPLANT
TROCAR ENDO BLADELESS 12MM (ENDOMECHANICALS) ×3 IMPLANT
TROCAR XCEL NON-BLD 5MMX100MML (ENDOMECHANICALS) ×3 IMPLANT
TUBE CONNECTING 12'X1/4 (SUCTIONS) ×1
TUBE CONNECTING 12X1/4 (SUCTIONS) ×2 IMPLANT
WARMER LAPAROSCOPE (MISCELLANEOUS) ×3 IMPLANT
YANKAUER SUCT 12FT TUBE ARGYLE (SUCTIONS) ×3 IMPLANT

## 2018-12-27 NOTE — Discharge Instructions (Signed)
Laparoscopic Appendectomy, Adult, Care After °This sheet gives you information about how to care for yourself after your procedure. Your health care provider may also give you more specific instructions. If you have problems or questions, contact your health care provider. °What can I expect after the procedure? °After the procedure, it is common to have: °· Little energy for normal activities. °· Mild pain in the area where the incisions were made. °· Difficulty passing stool (constipation). This can be caused by: °? Pain medicine. °? A decrease in your activity. °Follow these instructions at home: °Medicines °· Take over-the-counter and prescription medicines only as told by your health care provider. °· If you were prescribed an antibiotic medicine, take it as told by your health care provider. Do not stop taking the antibiotic even if you start to feel better. °· Do not drive or use heavy machinery while taking prescription pain medicine. °· Ask your health care provider if the medicine prescribed to you can cause constipation. You may need to take steps to prevent or treat constipation, such as: °? Drink enough fluid to keep your urine pale yellow. °? Take over-the-counter or prescription medicines. °? Eat foods that are high in fiber, such as beans, whole grains, and fresh fruits and vegetables. °? Limit foods that are high in fat and processed sugars, such as fried or sweet foods. °Incision care ° °· Follow instructions from your health care provider about how to take care of your incisions. Make sure you: °? Wash your hands with soap and water before and after you change your bandage (dressing). If soap and water are not available, use hand sanitizer. °? Change your dressing as told by your health care provider. °? Leave stitches (sutures), skin glue, or adhesive strips in place. These skin closures may need to stay in place for 2 weeks or longer. If adhesive strip edges start to loosen and curl up, you may  trim the loose edges. Do not remove adhesive strips completely unless your health care provider tells you to do that. °· Check your incision areas every day for signs of infection. Check for: °? Redness, swelling, or pain. °? Fluid or blood. °? Warmth. °? Pus or a bad smell. °Bathing °· Keep your incisions clean and dry. Clean them as often as told by your health care provider. To do this: °1. Gently wash the incisions with soap and water. °2. Rinse the incisions with water to remove all soap. °3. Pat the incisions dry with a clean towel. Do not rub the incisions. °· Do not take baths, swim, or use a hot tub for 2 weeks, or until your health care provider approves. You may take showers after 48 hours. °Activity ° °· Do not drive for 24 hours if you were given a sedative during your procedure. °· Rest after the procedure. Return to your normal activities as told by your health care provider. Ask your health care provider what activities are safe for you. °· For 3 weeks, or for as long as told by your health care provider: °? Do not lift anything that is heavier than 10 lb (4.5 kg), or the limit that you are told. °? Do not play contact sports. °General instructions °· If you were sent home with a drain, follow instructions from your health care provider about how to care for it. °· Take deep breaths. This helps to prevent your lungs from developing an infection (pneumonia). °· Keep all follow-up visits as told by your health   care provider. This is important. °Contact a health care provider if: °· You have redness, swelling, or pain around an incision. °· You have fluid or blood coming from an incision. °· Your incision feels warm to the touch. °· You have pus or a bad smell coming from an incision or dressing. °· Your incision edges break open after your sutures have been removed. °· You have increasing pain in your shoulders. °· You feel dizzy or you faint. °· You develop shortness of breath. °· You keep feeling  nauseous or you are vomiting. °· You have diarrhea or you cannot control your bowel functions. °· You lose your appetite. °· You develop swelling or pain in your legs. °· You develop a rash. °Get help right away if you have: °· A fever. °· Difficulty breathing. °· Sharp pains in your chest. °Summary °· After a laparoscopic appendectomy, it is common to have little energy for normal activities, mild pain in the area of the incisions, and constipation. °· Infection is the most common complication after this procedure. Follow your health care provider's instructions about caring for yourself after the procedure. °· Rest after the procedure. Return to your normal activities as told by your health care provider. °· Contact your health care provider if you notice signs of infection around your incisions or you develop shortness of breath. Get help right away if you have a fever, chest pain, or difficulty breathing. °This information is not intended to replace advice given to you by your health care provider. Make sure you discuss any questions you have with your health care provider. °Document Released: 01/11/2005 Document Revised: 07/14/2017 Document Reviewed: 07/14/2017 °Elsevier Patient Education © 2020 [employer] ° °

## 2018-12-27 NOTE — H&P (View-Only) (Signed)
Subjective: Patient still with right lower quadrant abdominal pain.  Objective: Vital signs in last 24 hours: Temp:  [97.5 F (36.4 C)-98.6 F (37 C)] 98.6 F (37 C) (12/02 0513) Pulse Rate:  [59-63] 59 (12/02 0513) Resp:  [16-18] 16 (12/02 0513) BP: (100-123)/(66-75) 100/66 (12/02 0513) SpO2:  [100 %] 100 % (12/02 0513) Last BM Date: 11/27/18  Intake/Output from previous day: 12/01 0701 - 12/02 0700 In: 240 [P.O.:240] Out: -  Intake/Output this shift: No intake/output data recorded.  General appearance: alert, cooperative and no distress Resp: clear to auscultation bilaterally Cardio: regular rate and rhythm, S1, S2 normal, no murmur, click, rub or gallop GI: Soft but still tender in the right lower quadrant to palpation.  No rigidity is noted.  Lab Results:  Recent Labs    12/26/18 0205 12/27/18 0509  WBC 8.6 7.0  HGB 11.3* 11.4*  HCT 35.3* 35.3*  PLT 249 246   BMET Recent Labs    12/26/18 0205 12/27/18 0509  NA 138 139  K 3.5 3.9  CL 106 107  CO2 25 25  GLUCOSE 78 107*  BUN 18 12  CREATININE 0.99 0.97  CALCIUM 8.8* 8.8*   PT/INR No results for input(s): LABPROT, INR in the last 72 hours.  Studies/Results: Ct Abdomen Pelvis W Contrast  Result Date: 12/26/2018 CLINICAL DATA:  Right upper abdominal pain EXAM: CT ABDOMEN AND PELVIS WITH CONTRAST TECHNIQUE: Multidetector CT imaging of the abdomen and pelvis was performed using the standard protocol following bolus administration of intravenous contrast. CONTRAST:  174mL OMNIPAQUE IOHEXOL 300 MG/ML  SOLN COMPARISON:  None. FINDINGS: Lower chest: No acute abnormality. Hepatobiliary: No focal hepatic abnormality. Gallbladder unremarkable. Pancreas: No focal abnormality or ductal dilatation. Spleen: No focal abnormality.  Normal size. Adrenals/Urinary Tract: No adrenal abnormality. No focal renal abnormality. No stones or hydronephrosis. Urinary bladder is unremarkable. Stomach/Bowel: The appendix is distended,  measuring up to 18 mm in greatest diameter. This is in the midportion of the appendix. The proximal and distal appendix are decompressed with the midportion distended with stool. There is no surrounding inflammatory change. Stomach, large and small bowel grossly unremarkable. Vascular/Lymphatic: No evidence of aneurysm or adenopathy. Reproductive: No visible focal abnormality. Other: No free fluid or free air. Left inguinal hernia containing fat and a knuckle of small bowel. No bowel obstruction. 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 no acute bony abnormality. IMPRESSION: Unusual appearance of the appendix which is decompressed proximally and distally, distended in the midportion measuring up to 18 mm, containing stool. There is no surrounding inflammatory change. I doubt acute appendicitis, but recommend clinical correlation for right lower quadrant pain. Left inguinal hernia containing fat and a knuckle of small bowel. No obstruction. Electronically Signed   By: Rolm Baptise M.D.   On: 12/26/2018 03:53    Anti-infectives: Anti-infectives (From admission, onward)   Start     Dose/Rate Route Frequency Ordered Stop   12/26/18 1600  piperacillin-tazobactam (ZOSYN) IVPB 3.375 g     3.375 g 12.5 mL/hr over 240 Minutes Intravenous Every 8 hours 12/26/18 1236     12/26/18 0730  piperacillin-tazobactam (ZOSYN) IVPB 3.375 g     3.375 g 100 mL/hr over 30 Minutes Intravenous  Once 12/26/18 0722 12/26/18 1013      Assessment/Plan: Impression: Persistent right lower quadrant abdominal pain with dilated appendix Plan: We will proceed to the operating room for laparoscopic appendectomy.  The risks and benefits of the procedure including bleeding, infection, and the possibility of  an open procedure were fully explained to the patient, who gave informed consent.  LOS: 0 days    Tyler Chang 12/27/2018

## 2018-12-27 NOTE — Anesthesia Preprocedure Evaluation (Signed)
Anesthesia Evaluation  Patient identified by MRN, date of birth, ID band Patient awake    Reviewed: Allergy & Precautions, NPO status , Patient's Chart, lab work & pertinent test results  Airway Mallampati: II  TM Distance: >3 FB Neck ROM: Full    Dental no notable dental hx. (+) Missing, Poor Dentition   Pulmonary asthma ,  States no inhaler use in over a year    Pulmonary exam normal breath sounds clear to auscultation       Cardiovascular Exercise Tolerance: Good negative cardio ROS Normal cardiovascular examI Rhythm:Regular Rate:Normal     Neuro/Psych negative neurological ROS  negative psych ROS   GI/Hepatic negative GI ROS, Neg liver ROS,   Endo/Other  negative endocrine ROS  Renal/GU negative Renal ROS  negative genitourinary   Musculoskeletal negative musculoskeletal ROS (+)   Abdominal   Peds negative pediatric ROS (+)  Hematology negative hematology ROS (+)   Anesthesia Other Findings   Reproductive/Obstetrics negative OB ROS                             Anesthesia Physical Anesthesia Plan  ASA: II  Anesthesia Plan: General   Post-op Pain Management:    Induction: Intravenous  PONV Risk Score and Plan: 2 and Midazolam, Ondansetron and Treatment may vary due to age or medical condition  Airway Management Planned: Oral ETT  Additional Equipment:   Intra-op Plan:   Post-operative Plan: Extubation in OR  Informed Consent: I have reviewed the patients History and Physical, chart, labs and discussed the procedure including the risks, benefits and alternatives for the proposed anesthesia with the patient or authorized representative who has indicated his/her understanding and acceptance.     Dental advisory given  Plan Discussed with: CRNA  Anesthesia Plan Comments: (Plan Full PPE use Plan GETA D/W PT -WTP with same after Q&A)        Anesthesia Quick  Evaluation

## 2018-12-27 NOTE — Interval H&P Note (Signed)
History and Physical Interval Note:  12/27/2018 11:29 AM  Tyler Chang  has presented today for surgery, with the diagnosis of acute appendicitis.  The various methods of treatment have been discussed with the patient and family. After consideration of risks, benefits and other options for treatment, the patient has consented to  Procedure(s): APPENDECTOMY LAPAROSCOPIC (N/A) as a surgical intervention.  The patient's history has been reviewed, patient examined, no change in status, stable for surgery.  I have reviewed the patient's chart and labs.  Questions were answered to the patient's satisfaction.     Aviva Signs

## 2018-12-27 NOTE — Op Note (Signed)
Patient:  Tyler Chang  DOB:  10/31/1960  MRN:  [id number]   Preop Diagnosis: Acute appendicitis  Postop Diagnosis: Same, omental inflammation  Procedure: Laparoscopic appendectomy, biopsy of omentum  Surgeon: Aviva Signs, MD  Anes: General endotracheal  Indications: Patient is a 58 year old black male who presents with a several day history of worsening right lower quadrant abdominal pain.  CT scan of the abdomen reveals a dilated appendix.  He initially was brought into the hospital for observation.  His abdominal pain continued, thus he is being brought to the operating room for laparoscopic appendectomy.  The risks and benefits of the procedure including bleeding, infection, and the possibility of an open procedure were fully explained to the patient, who gave informed consent.  Procedure note: The patient was placed in supine position.  After induction of general endotracheal anesthesia, the abdomen was prepped and draped using usual sterile technique with ChloraPrep.  Surgical site confirmation was performed.  A supraumbilical incision was made down to the fascia.  A Veress needle was introduced into the abdominal cavity and confirmation of placement was done using saline drop test.  The abdomen was then insufflated to 15 mmHg pressure.  An 11 mm trocar was introduced into the abdominal cavity under direct visualization without difficulty.  Patient was placed in deeper Trendelenburg position and an additional 12 mm trocar was placed in the suprapubic region and a 5 mm trocar was placed in left lower quadrant region.  The appendix was visualized and then to be diffusely dilated down to its base.  The mesoappendix was divided using the harmonic scalpel down to the juncture of the appendix to the cecum.  A standard Endo GIA was placed across the base the appendix and fired.  The staple line was inspected and noted to be within normal limits.  In addition, the patient was noted to have an  inflamed area of omentum that extended from the right upper quadrant to the stomach wall.  The etiology of this is unknown.  This was excised using the harmonic scalpel.  Both the omental section as well as the appendix were removed using an Endo Catch bag without difficulty.  All fluid and air were then evacuated from the abdominal cavity prior to removal of the trochars.  All wounds were irrigated with normal saline.  All wounds were injected with Exparel.  The supraumbilical fascia as well as suprapubic fascia were reapproximated using 0 Vicryl interrupted sutures.  All skin incisions were closed using a 4-0 Monocryl subcuticular suture.  Dermabond was applied.  All tape needle counts were correct at the end of the procedure.  The patient was extubated in the operating room and transferred to PACU in stable condition.   Complications: None  EBL: Minimal  Specimen: Appendix, omentum

## 2018-12-27 NOTE — Transfer of Care (Signed)
Immediate Anesthesia Transfer of Care Note  Patient: Tyler Chang  Procedure(s) Performed: APPENDECTOMY LAPAROSCOPIC (N/A )  Patient Location: PACU  Anesthesia Type:General  Level of Consciousness: awake  Airway & Oxygen Therapy: Patient Spontanous Breathing  Post-op Assessment: Report given to RN  Post vital signs: Reviewed and stable  Last Vitals:  Vitals Value Taken Time  BP 151/88 12/27/18 1340  Temp    Pulse 75 12/27/18 1344  Resp 19 12/27/18 1344  SpO2 98 % 12/27/18 1344  Vitals shown include unvalidated device data.  Last Pain:  Vitals:   12/27/18 1113  TempSrc: Oral  PainSc: 0-No pain      Patients Stated Pain Goal: 2 (07/86/75 4492)  Complications: No apparent anesthesia complications

## 2018-12-27 NOTE — Anesthesia Procedure Notes (Signed)
Procedure Name: Intubation Date/Time: 12/27/2018 12:43 PM Performed by: Ollen Bowl, CRNA Pre-anesthesia Checklist: Patient identified, Patient being monitored, Timeout performed, Emergency Drugs available and Suction available Patient Re-evaluated:Patient Re-evaluated prior to induction Oxygen Delivery Method: Circle system utilized Preoxygenation: Pre-oxygenation with 100% oxygen Induction Type: IV induction Ventilation: Mask ventilation without difficulty Laryngoscope Size: Mac and 3 Grade View: Grade I Tube type: Oral Tube size: 7.0 mm Number of attempts: 1 Airway Equipment and Method: Stylet Placement Confirmation: ETT inserted through vocal cords under direct vision,  positive ETCO2 and breath sounds checked- equal and bilateral Secured at: 21 cm Tube secured with: Tape Dental Injury: Teeth and Oropharynx as per pre-operative assessment

## 2018-12-27 NOTE — Progress Notes (Signed)
Subjective: Patient still with right lower quadrant abdominal pain.  Objective: Vital signs in last 24 hours: Temp:  [97.5 F (36.4 C)-98.6 F (37 C)] 98.6 F (37 C) (12/02 0513) Pulse Rate:  [59-63] 59 (12/02 0513) Resp:  [16-18] 16 (12/02 0513) BP: (100-123)/(66-75) 100/66 (12/02 0513) SpO2:  [100 %] 100 % (12/02 0513) Last BM Date: 11/27/18  Intake/Output from previous day: 12/01 0701 - 12/02 0700 In: 240 [P.O.:240] Out: -  Intake/Output this shift: No intake/output data recorded.  General appearance: alert, cooperative and no distress Resp: clear to auscultation bilaterally Cardio: regular rate and rhythm, S1, S2 normal, no murmur, click, rub or gallop GI: Soft but still tender in the right lower quadrant to palpation.  No rigidity is noted.  Lab Results:  Recent Labs    12/26/18 0205 12/27/18 0509  WBC 8.6 7.0  HGB 11.3* 11.4*  HCT 35.3* 35.3*  PLT 249 246   BMET Recent Labs    12/26/18 0205 12/27/18 0509  NA 138 139  K 3.5 3.9  CL 106 107  CO2 25 25  GLUCOSE 78 107*  BUN 18 12  CREATININE 0.99 0.97  CALCIUM 8.8* 8.8*   PT/INR No results for input(s): LABPROT, INR in the last 72 hours.  Studies/Results: Ct Abdomen Pelvis W Contrast  Result Date: 12/26/2018 CLINICAL DATA:  Right upper abdominal pain EXAM: CT ABDOMEN AND PELVIS WITH CONTRAST TECHNIQUE: Multidetector CT imaging of the abdomen and pelvis was performed using the standard protocol following bolus administration of intravenous contrast. CONTRAST:  174mL OMNIPAQUE IOHEXOL 300 MG/ML  SOLN COMPARISON:  None. FINDINGS: Lower chest: No acute abnormality. Hepatobiliary: No focal hepatic abnormality. Gallbladder unremarkable. Pancreas: No focal abnormality or ductal dilatation. Spleen: No focal abnormality.  Normal size. Adrenals/Urinary Tract: No adrenal abnormality. No focal renal abnormality. No stones or hydronephrosis. Urinary bladder is unremarkable. Stomach/Bowel: The appendix is distended,  measuring up to 18 mm in greatest diameter. This is in the midportion of the appendix. The proximal and distal appendix are decompressed with the midportion distended with stool. There is no surrounding inflammatory change. Stomach, large and small bowel grossly unremarkable. Vascular/Lymphatic: No evidence of aneurysm or adenopathy. Reproductive: No visible focal abnormality. Other: No free fluid or free air. Left inguinal hernia containing fat and a knuckle of small bowel. No bowel obstruction. 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 no acute bony abnormality. IMPRESSION: Unusual appearance of the appendix which is decompressed proximally and distally, distended in the midportion measuring up to 18 mm, containing stool. There is no surrounding inflammatory change. I doubt acute appendicitis, but recommend clinical correlation for right lower quadrant pain. Left inguinal hernia containing fat and a knuckle of small bowel. No obstruction. Electronically Signed   By: Rolm Baptise M.D.   On: 12/26/2018 03:53    Anti-infectives: Anti-infectives (From admission, onward)   Start     Dose/Rate Route Frequency Ordered Stop   12/26/18 1600  piperacillin-tazobactam (ZOSYN) IVPB 3.375 g     3.375 g 12.5 mL/hr over 240 Minutes Intravenous Every 8 hours 12/26/18 1236     12/26/18 0730  piperacillin-tazobactam (ZOSYN) IVPB 3.375 g     3.375 g 100 mL/hr over 30 Minutes Intravenous  Once 12/26/18 0722 12/26/18 1013      Assessment/Plan: Impression: Persistent right lower quadrant abdominal pain with dilated appendix Plan: We will proceed to the operating room for laparoscopic appendectomy.  The risks and benefits of the procedure including bleeding, infection, and the possibility of  an open procedure were fully explained to the patient, who gave informed consent.  LOS: 0 days    Franky Macho 12/27/2018

## 2018-12-28 ENCOUNTER — Encounter (HOSPITAL_COMMUNITY): Payer: Self-pay | Admitting: General Surgery

## 2018-12-28 NOTE — Anesthesia Postprocedure Evaluation (Addendum)
Anesthesia Post Note  Patient: Tyler Chang  Procedure(s) Performed: APPENDECTOMY LAPAROSCOPIC (N/A )  Patient location during evaluation: PACU Anesthesia Type: General Level of consciousness: awake and alert and oriented Pain management: pain level controlled Vital Signs Assessment: post-procedure vital signs reviewed and stable Respiratory status: spontaneous breathing Cardiovascular status: blood pressure returned to baseline and stable Postop Assessment: no apparent nausea or vomiting Anesthetic complications: no Comments: Late entry     Last Vitals:  Vitals:   12/27/18 1345 12/27/18 1419  BP: (!) 159/94 (!) 159/88  Pulse: 76 63  Resp: 13 14  Temp: 36.4 C 36.6 C  SpO2: 99% 100%    Last Pain:  Vitals:   12/27/18 1439  TempSrc:   PainSc: 2                  Compton Brigance

## 2018-12-29 NOTE — Discharge Summary (Signed)
Physician Discharge Summary  Patient ID: Tyler Chang MRN: [id number] DOB/AGE: Jan 13, 1961 58 y.o.  Admit date: 12/26/2018 Discharge date: 12/27/2018 Admission Diagnoses: Acute appendicitis  Discharge Diagnoses:  Active Problems:   Right lower quadrant abdominal pain   Acute appendicitis   Discharged Condition: good  Hospital Course: Patient is a 58 year old black male who presented with a multiple day history of right lower quadrant abdominal pain.  Initial CAT scan of the abdomen revealed a dilated gallbladder but no periappendiceal inflammation.  His white blood cell count was within normal limits.  He was admitted to the hospital for observation.  Serial examination revealed persistent right lower quadrant abdominal pain.  He subsequently underwent laparoscopic appendectomy on 12/27/2018.  At the time of surgery, he was also found to have inflamed omentum attached to the anterior abdominal wall of unknown etiology.  This was excised and sent to pathology for further examination.  Final pathology is pending.  The patient was discharged home on 12/27/2018 in good and improving condition.  Treatments: surgery: Laparoscopic appendectomy, partial omentectomy on 12/27/2018  Discharge Exam: Blood pressure (!) 159/88, pulse 63, temperature 97.8 F (36.6 C), resp. rate 14, height 5\' 4"  (1.626 m), weight 68.9 kg, SpO2 100 %. General appearance: alert, cooperative and no distress Resp: clear to auscultation bilaterally Cardio: regular rate and rhythm, S1, S2 normal, no murmur, click, rub or gallop GI: Soft, incisions healing well.  Disposition: Home  Discharge Instructions    Diet - low sodium heart healthy   Complete by: As directed    Increase activity slowly   Complete by: As directed      Allergies as of 12/27/2018   No Known Allergies     Medication List    STOP taking these medications   meloxicam 15 MG tablet Commonly known as: Mobic     TAKE these medications   naproxen  sodium 220 MG tablet Commonly known as: ALEVE Take 220 mg by mouth 2 (two) times daily as needed.   traMADol 50 MG tablet Commonly known as: Ultram Take 1 tablet (50 mg total) by mouth every 6 (six) hours as needed.      Follow-up Information    Aviva Signs, MD.   Specialty: General Surgery Why: As needed.  Will call you next week for follow up Contact information: [address] [place] [address] [phone]           Signed: Aviva Signs 12/29/2018, 9:04 AM

## 2019-01-01 LAB — SURGICAL PATHOLOGY

## 2019-01-04 ENCOUNTER — Telehealth: Payer: Self-pay | Admitting: General Surgery

## 2019-01-04 ENCOUNTER — Other Ambulatory Visit: Payer: Self-pay

## 2019-01-04 ENCOUNTER — Telehealth (INDEPENDENT_AMBULATORY_CARE_PROVIDER_SITE_OTHER): Payer: Self-pay | Admitting: General Surgery

## 2019-01-04 DIAGNOSIS — Z09 Encounter for follow-up examination after completed treatment for conditions other than malignant neoplasm: Secondary | ICD-10-CM

## 2019-01-04 NOTE — Telephone Encounter (Signed)
Telephone visit performed.  Patient states he is doing well.  He has no complaints.  He denies any fevers or chills.  Final pathology revealed acute appendicitis with inflamed omentum but no malignancy seen.  Patient was made aware of this.  He was told to call my office should any problems arise.  He is pleased with results.

## 2019-01-10 ENCOUNTER — Other Ambulatory Visit: Payer: Self-pay

## 2019-01-10 ENCOUNTER — Ambulatory Visit: Payer: Managed Care, Other (non HMO) | Attending: Internal Medicine

## 2019-01-10 DIAGNOSIS — U071 COVID-19: Secondary | ICD-10-CM | POA: Insufficient documentation

## 2019-01-10 DIAGNOSIS — Z20822 Contact with and (suspected) exposure to covid-19: Secondary | ICD-10-CM

## 2019-01-11 ENCOUNTER — Telehealth: Payer: Self-pay

## 2019-01-11 NOTE — Telephone Encounter (Signed)
Wife called to get test results. Results still pending. Encouraged her to check MyChart or call back. She voices understanding and denied any questions.   Pt has multiple charts in epic. Have sent a request to nurse triage to have profiles merged   [facility]

## 2019-01-11 NOTE — Progress Notes (Signed)
Moving orders to this encounter.  

## 2019-01-11 NOTE — Telephone Encounter (Signed)
Entered in error. Please disregard.   [place]

## 2019-01-11 NOTE — Progress Notes (Signed)
Order(s) created erroneously. Erroneous order ID: [id number]  Order moved by: Remingtyn Depaola M  Order move date/time: 01/11/2019 1:14 PM  Source Patient: [id number]  Source Contact: 01/10/2019  Destination Patient: [id number]  Destination Contact: 01/10/2019 

## 2019-01-11 NOTE — Progress Notes (Signed)
Order(s) created erroneously. Erroneous order ID: [id number]  Order moved by: Brigitte Pulse  Order move date/time: 01/11/2019 1:14 PM  Source Patient: [id number]  Source Contact: 01/10/2019  Destination Patient: [id number]  Destination Contact: 01/10/2019

## 2019-01-12 LAB — NOVEL CORONAVIRUS, NAA: SARS-CoV-2, NAA: DETECTED — AB

## 2019-09-13 ENCOUNTER — Other Ambulatory Visit: Payer: PRIVATE HEALTH INSURANCE

## 2019-09-13 ENCOUNTER — Other Ambulatory Visit: Payer: Self-pay

## 2019-09-13 DIAGNOSIS — Z20822 Contact with and (suspected) exposure to covid-19: Secondary | ICD-10-CM

## 2019-09-14 LAB — SARS-COV-2, NAA 2 DAY TAT

## 2019-09-14 LAB — NOVEL CORONAVIRUS, NAA: SARS-CoV-2, NAA: NOT DETECTED

## 2019-10-02 ENCOUNTER — Emergency Department (HOSPITAL_COMMUNITY): Payer: Managed Care, Other (non HMO)

## 2019-10-02 ENCOUNTER — Other Ambulatory Visit: Payer: Self-pay

## 2019-10-02 ENCOUNTER — Emergency Department (HOSPITAL_COMMUNITY)
Admission: EM | Admit: 2019-10-02 | Discharge: 2019-10-03 | Disposition: A | Discharge status: Home / Self Care | Payer: Managed Care, Other (non HMO) | Attending: Emergency Medicine | Admitting: Emergency Medicine

## 2019-10-02 DIAGNOSIS — M25519 Pain in unspecified shoulder: Secondary | ICD-10-CM | POA: Insufficient documentation

## 2019-10-02 DIAGNOSIS — J45909 Unspecified asthma, uncomplicated: Secondary | ICD-10-CM | POA: Insufficient documentation

## 2019-10-02 DIAGNOSIS — M79601 Pain in right arm: Secondary | ICD-10-CM | POA: Insufficient documentation

## 2019-10-02 DIAGNOSIS — M542 Cervicalgia: Secondary | ICD-10-CM | POA: Diagnosis not present

## 2019-10-02 DIAGNOSIS — M25511 Pain in right shoulder: Secondary | ICD-10-CM

## 2019-10-02 NOTE — ED Triage Notes (Signed)
Pt arrives [facility] for right shoulder pain for the past week.

## 2019-10-03 MED ORDER — DICLOFENAC SODIUM 1 % EX GEL: 2.0000 g | Freq: Four times a day (QID) | CUTANEOUS | 0 refills | Status: DC | PRN

## 2019-10-03 NOTE — ED Provider Notes (Signed)
Emergency Department Provider Note   I have reviewed the triage vital signs and the nursing notes.   HISTORY  Chief Complaint Shoulder Pain   HPI Tyler Chang is a 59 y.o. male with PMH reviewed presents to the ED with right shoulder pain over the last week. Patient has a job with repetitive lifting but denies any clear inciting event or injury. Denies any numbness/weakness in the right arm/hand. Some right shoulder/lateral neck pain with occasional radiation down the arm. No leg symptoms. No CP or SOB symptoms. No other modifying factors.    Past Medical History:  Diagnosis Date  . Asthma     Patient Active Problem List   Diagnosis Date Noted  . Acute appendicitis   . Right lower quadrant abdominal pain 12/26/2018  . FATIGUE 05/02/2009  . ELEVATED BLOOD PRESSURE WITHOUT DIAGNOSIS OF HYPERTENSION 05/02/2009    Past Surgical History:  Procedure Laterality Date  . LAPAROSCOPIC APPENDECTOMY N/A 12/27/2018   Procedure: APPENDECTOMY LAPAROSCOPIC;  Surgeon: Franky Macho, MD;  Location: [facility];  Service: General;  Laterality: N/A;  . ORTHOPEDIC SURGERY      Allergies Patient has no known allergies.  No family history on file.  Social History Social History   Tobacco Use  . Smoking status: Never Smoker  . Smokeless tobacco: Never Used  Substance Use Topics  . Alcohol use: No    Comment: occasional  . Drug use: No    Review of Systems  Constitutional: No fever/chills Eyes: No visual changes. ENT: No sore throat. Cardiovascular: Denies chest pain. Respiratory: Denies shortness of breath. Gastrointestinal: No abdominal pain.  No nausea, no vomiting.  No diarrhea.  No constipation. Genitourinary: Negative for dysuria. Musculoskeletal: Positive right shoulder and neck pain.  Skin: Negative for rash. Neurological: Negative for headaches, focal weakness or numbness.  10-point ROS otherwise negative.  ____________________________________________   PHYSICAL  EXAM:  VITAL SIGNS: ED Triage Vitals  Enc Vitals Group     BP 10/02/19 2113 (!) 149/102     Pulse Rate 10/02/19 2113 83     Resp 10/02/19 2113 17     Temp 10/02/19 2113 98 F (36.7 C)     Temp Source 10/02/19 2113 Oral     SpO2 10/02/19 2113 97 %     Weight 10/02/19 2111 149 lb (67.6 kg)     Height 10/02/19 2111 5\' 4"  (1.626 m)   Constitutional: Alert and oriented. Well appearing and in no acute distress. Eyes: Conjunctivae are normal.  Head: Atraumatic. Nose: No congestion/rhinnorhea. Mouth/Throat: Mucous membranes are moist.   Neck: No stridor. No cervical spine tenderness to palpation. Cardiovascular: Normal rate, regular rhythm. Good peripheral circulation. Grossly normal heart sounds.   Respiratory: Normal respiratory effort.  No retractions. Lungs CTAB. Gastrointestinal: Soft and nontender. No distention.  Musculoskeletal: Normal ROM of the right shoulder and elbow. No joint effusion or erythema/warmth.  Neurologic:  Normal speech and language. No gross focal neurologic deficits are appreciated. Normal strength and sensation in the bilateral upper extremities.  Skin:  Skin is warm, dry and intact. No rash noted. ____________________________________________  RADIOLOGY  DG Shoulder Right  Result Date: 10/02/2019 CLINICAL DATA:  Shoulder pain EXAM: RIGHT SHOULDER - 2+ VIEW COMPARISON:  None. FINDINGS: There is no evidence of fracture or dislocation. There is no evidence of arthropathy or other focal bone abnormality. Soft tissues are unremarkable. IMPRESSION: Negative. Electronically Signed   By: 12/02/2019 M.D.   On: 10/02/2019 22:18    ____________________________________________  PROCEDURES  Procedure(s) performed:   Procedures  None  ____________________________________________   INITIAL IMPRESSION / ASSESSMENT AND PLAN / ED COURSE  Pertinent labs & imaging results that were available during my care of the patient were reviewed by me and considered in my  medical decision making (see chart for details).   Patient presents to the ED with right shoulder pain. Question rotator cuff injury vs mild radicular pain. No neuro deficits on exam or history to prompt emergent spine imaging. Plan for pain mgmt at home along with PCP and ortho f/u as an outpatient.    ____________________________________________  FINAL CLINICAL IMPRESSION(S) / ED DIAGNOSES  Final diagnoses:  Acute pain of right shoulder    NEW OUTPATIENT MEDICATIONS STARTED DURING THIS VISIT:  New Prescriptions   DICLOFENAC SODIUM (VOLTAREN) 1 % GEL    Apply 2 g topically 4 (four) times daily as needed.    Note:  This document was prepared using Dragon voice recognition software and may include unintentional dictation errors.  Alona Bene, MD, [facility] Emergency Medicine    Aolani Piggott, Arlyss Repress, MD 10/03/19 [phone]

## 2019-10-03 NOTE — Discharge Instructions (Signed)

## 2019-12-28 ENCOUNTER — Other Ambulatory Visit: Payer: Self-pay

## 2019-12-28 ENCOUNTER — Ambulatory Visit
Admission: EM | Admit: 2019-12-28 | Discharge: 2019-12-28 | Disposition: A | Discharge status: Home / Self Care | Payer: PRIVATE HEALTH INSURANCE | Attending: Internal Medicine | Admitting: Internal Medicine

## 2019-12-28 DIAGNOSIS — Z1152 Encounter for screening for COVID-19: Secondary | ICD-10-CM

## 2019-12-28 NOTE — ED Triage Notes (Signed)
Pt presents for COVID screening.  Denies any symptoms including cough, fever, SOB, body aches.

## 2019-12-29 LAB — SARS-COV-2, NAA 2 DAY TAT

## 2019-12-29 LAB — NOVEL CORONAVIRUS, NAA: SARS-CoV-2, NAA: NOT DETECTED

## 2020-03-16 ENCOUNTER — Emergency Department (HOSPITAL_COMMUNITY)
Admission: EM | Admit: 2020-03-16 | Discharge: 2020-03-16 | Disposition: A | Discharge status: Home / Self Care | Payer: PRIVATE HEALTH INSURANCE | Attending: Emergency Medicine | Admitting: Emergency Medicine

## 2020-03-16 ENCOUNTER — Encounter (HOSPITAL_COMMUNITY): Payer: Self-pay | Admitting: Emergency Medicine

## 2020-03-16 ENCOUNTER — Other Ambulatory Visit: Payer: Self-pay

## 2020-03-16 DIAGNOSIS — Z20822 Contact with and (suspected) exposure to covid-19: Secondary | ICD-10-CM | POA: Insufficient documentation

## 2020-03-16 DIAGNOSIS — J45909 Unspecified asthma, uncomplicated: Secondary | ICD-10-CM | POA: Insufficient documentation

## 2020-03-16 DIAGNOSIS — J069 Acute upper respiratory infection, unspecified: Secondary | ICD-10-CM

## 2020-03-16 LAB — SARS CORONAVIRUS 2 (TAT 6-24 HRS): SARS Coronavirus 2: NEGATIVE

## 2020-03-16 NOTE — Discharge Instructions (Addendum)
Call your primary care doctor or specialist as discussed in the next 2-3 days.   Return immediately back to the ER if:  Your symptoms worsen within the next 12-24 hours. You develop new symptoms such as new fevers, persistent vomiting, new pain, shortness of breath, or new weakness or numbness, or if you have any other concerns.  

## 2020-03-16 NOTE — ED Triage Notes (Signed)
Pt reports generalized body aches and runny nose since this morning.

## 2020-03-16 NOTE — ED Provider Notes (Signed)
[facility] EMERGENCY DEPARTMENT Provider Note   CSN: [id number] Arrival date & time: 03/16/20  1312     History Chief Complaint  Patient presents with  . Generalized Body Aches    AMMAR MOFFATT is a 60 y.o. male.  Patient presents with generalized fatigue and a runny nose starting today in the morning.  Also complaining of some tingling he had in the right arm earlier today which is now resolved.  Otherwise denies fevers or cough.  Denies any headache or chest pain or abdominal pain.        Past Medical History:  Diagnosis Date  . Asthma     Patient Active Problem List   Diagnosis Date Noted  . Acute appendicitis   . Right lower quadrant abdominal pain 12/26/2018  . FATIGUE 05/02/2009  . ELEVATED BLOOD PRESSURE WITHOUT DIAGNOSIS OF HYPERTENSION 05/02/2009    Past Surgical History:  Procedure Laterality Date  . LAPAROSCOPIC APPENDECTOMY N/A 12/27/2018   Procedure: APPENDECTOMY LAPAROSCOPIC;  Surgeon: Franky Macho, MD;  Location: [facility];  Service: General;  Laterality: N/A;  . ORTHOPEDIC SURGERY         No family history on file.  Social History   Tobacco Use  . Smoking status: Never Smoker  . Smokeless tobacco: Never Used  Substance Use Topics  . Alcohol use: No    Comment: occasional  . Drug use: No    Home Medications Prior to Admission medications   Medication Sig Start Date End Date Taking? Authorizing Provider  diclofenac Sodium (VOLTAREN) 1 % GEL Apply 2 g topically 4 (four) times daily as needed. 10/03/19   Long, Arlyss Repress, MD  naproxen sodium (ALEVE) 220 MG tablet Take 220 mg by mouth 2 (two) times daily as needed.    [provider]  traMADol (ULTRAM) 50 MG tablet Take 1 tablet (50 mg total) by mouth every 6 (six) hours as needed. 12/27/18   Franky Macho, MD    Allergies    Patient has no known allergies.  Review of Systems   Review of Systems  Constitutional: Negative for fever.  HENT: Negative for ear pain and  sore throat.   Eyes: Negative for pain.  Respiratory: Negative for cough.   Cardiovascular: Negative for chest pain.  Gastrointestinal: Negative for abdominal pain.  Genitourinary: Negative for flank pain.  Musculoskeletal: Negative for back pain.  Skin: Negative for color change and rash.  Neurological: Negative for syncope.  All other systems reviewed and are negative.   Physical Exam Updated Vital Signs BP 118/76 (BP Location: Right Arm)   Pulse (!) 54   Temp 98.4 F (36.9 C) (Oral)   Resp (!) 21   SpO2 99%   Physical Exam Constitutional:      General: He is not in acute distress.    Appearance: He is well-developed.  HENT:     Head: Normocephalic.     Nose: Nose normal.  Eyes:     Extraocular Movements: Extraocular movements intact.  Cardiovascular:     Rate and Rhythm: Normal rate.  Pulmonary:     Effort: Pulmonary effort is normal.  Skin:    Coloration: Skin is not jaundiced.  Neurological:     General: No focal deficit present.     Mental Status: He is alert. Mental status is at baseline.     Comments: 5/5 strength extremities.  Cranial nerves II to XII intact.  Gait normal without assistance.     ED Results / Procedures /  Treatments   Labs (all labs ordered are listed, but only abnormal results are displayed) Labs Reviewed  SARS CORONAVIRUS 2 (TAT 6-24 HRS)    EKG None  Radiology No results found.  Procedures Procedures   Medications Ordered in ED Medications - No data to display  ED Course  I have reviewed the triage vital signs and the nursing notes.  Pertinent labs & imaging results that were available during my care of the patient were reviewed by me and considered in my medical decision making (see chart for details).    MDM Rules/Calculators/A&P                          Covid test sent and negative.  EKG performed as well showing sinus rhythm, no ST elevation or depression, normal rate seen.  Patient otherwise has no pain normal  vital signs appears comfortable.  Will recommend monitoring with his primary care doctor in 3 to 4 days.  Advised immediate return if he has chest pain or difficulty breathing or any additional concerns.   Final Clinical Impression(s) / ED Diagnoses Final diagnoses:  Upper respiratory tract infection, unspecified type    Rx / DC Orders ED Discharge Orders    None       Cheryll Cockayne, MD 03/16/20 2009

## 2020-12-17 IMAGING — DX DG SHOULDER 2+V*R*
3 series · 3 of 3 positions shown · non-contrast
Comparison: None.

CLINICAL DATA: Shoulder pain

EXAM:
RIGHT SHOULDER - 2+ VIEW

[shoulder grashey]
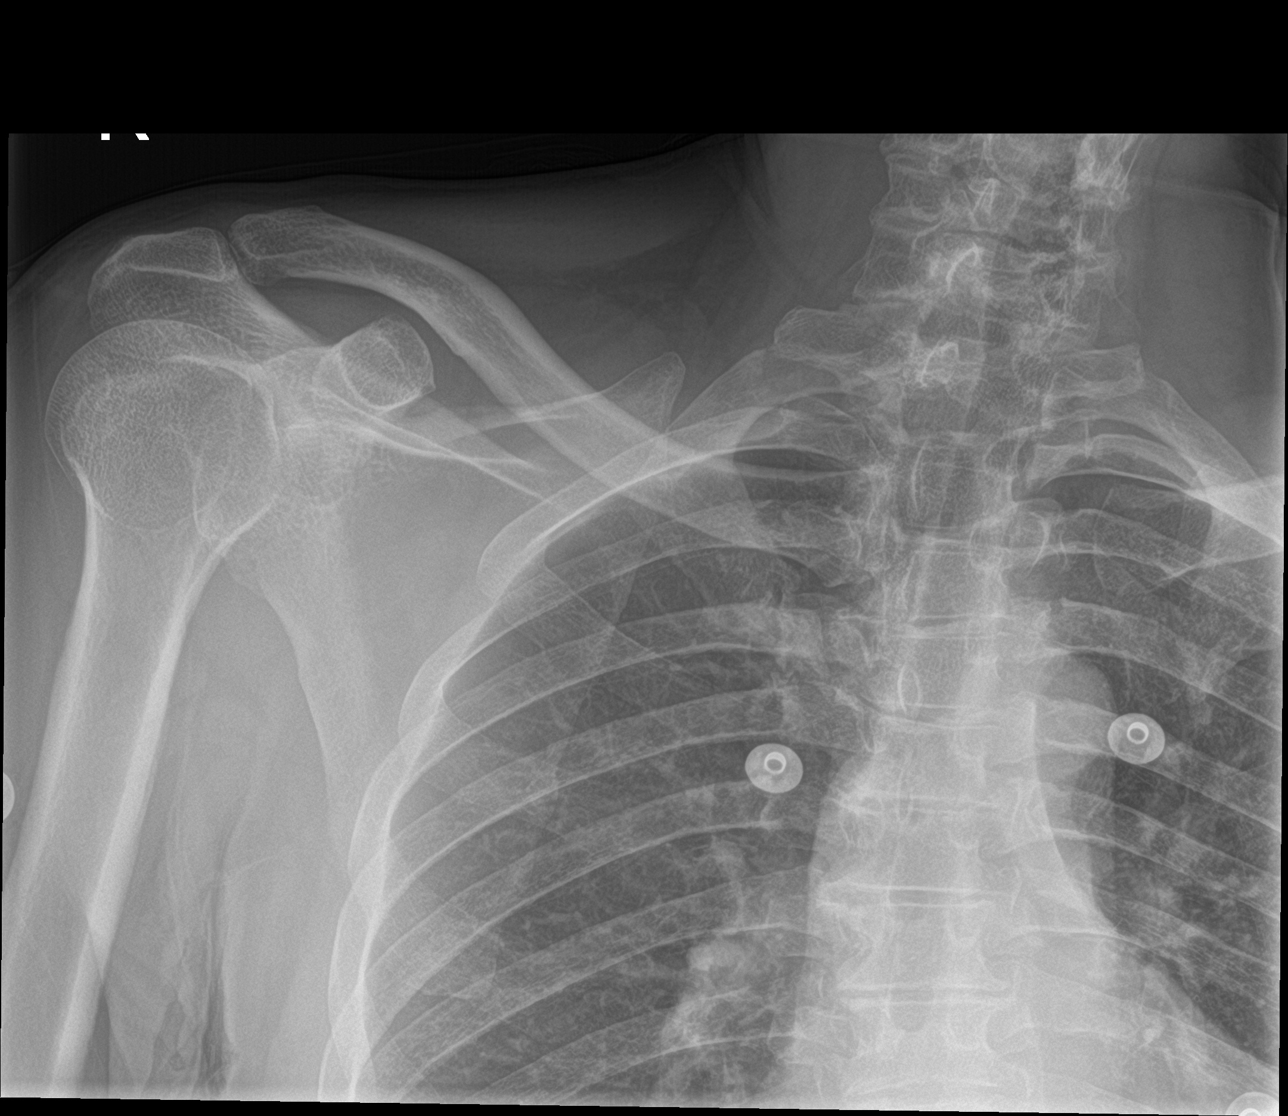

[shoulder y view]
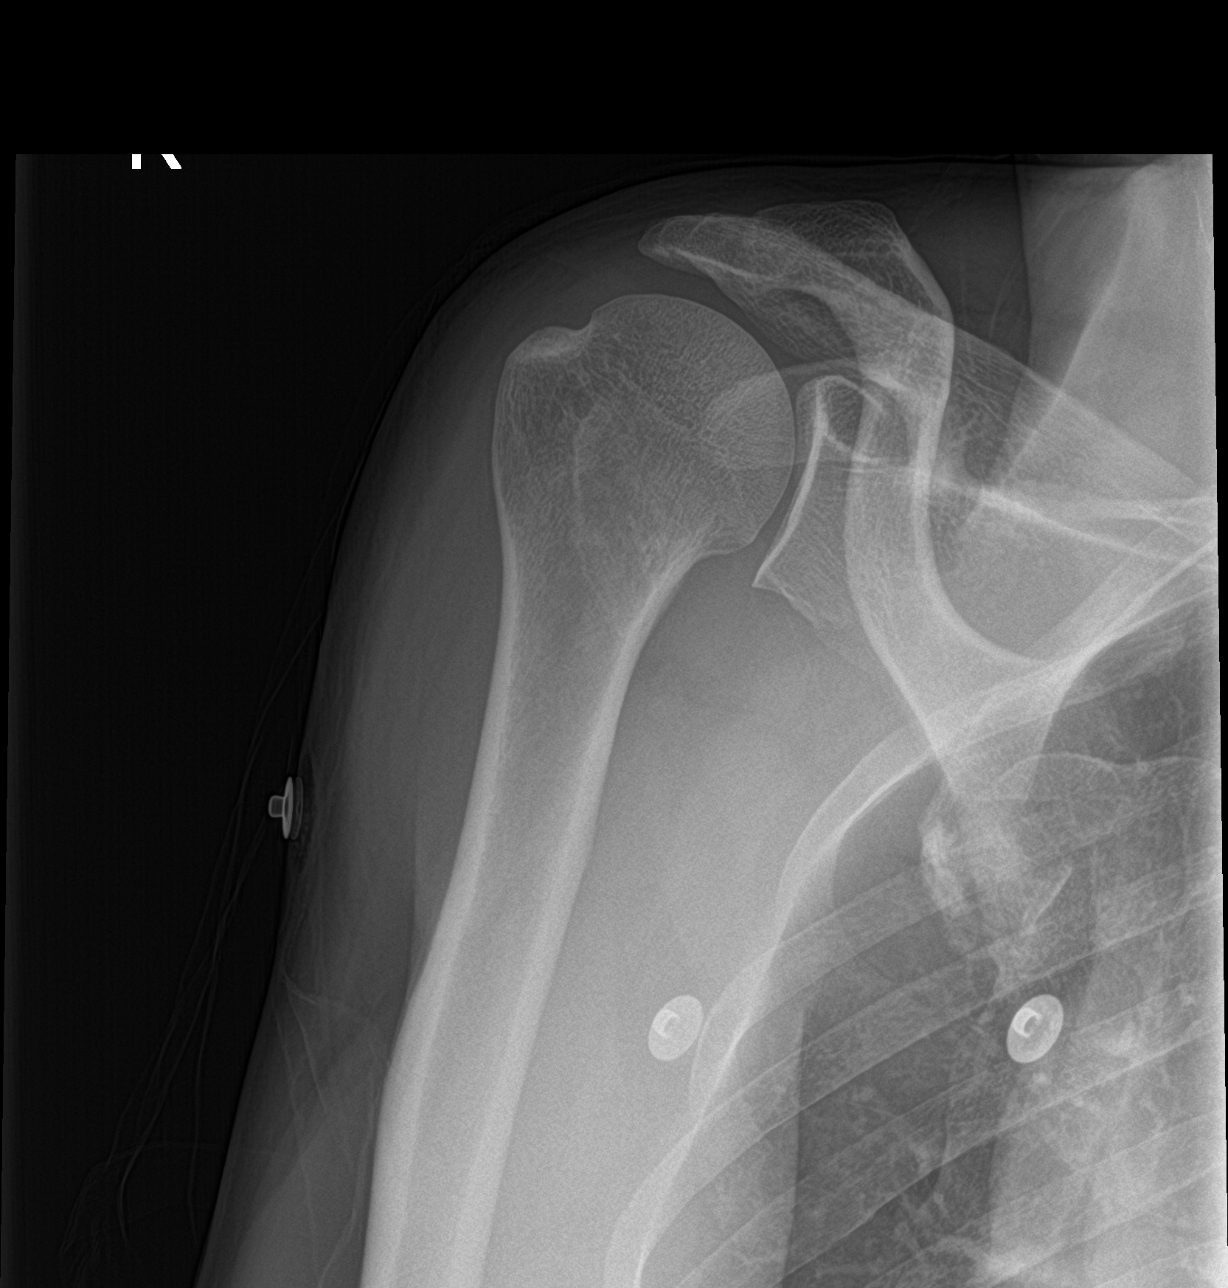

[shoulder axillary]
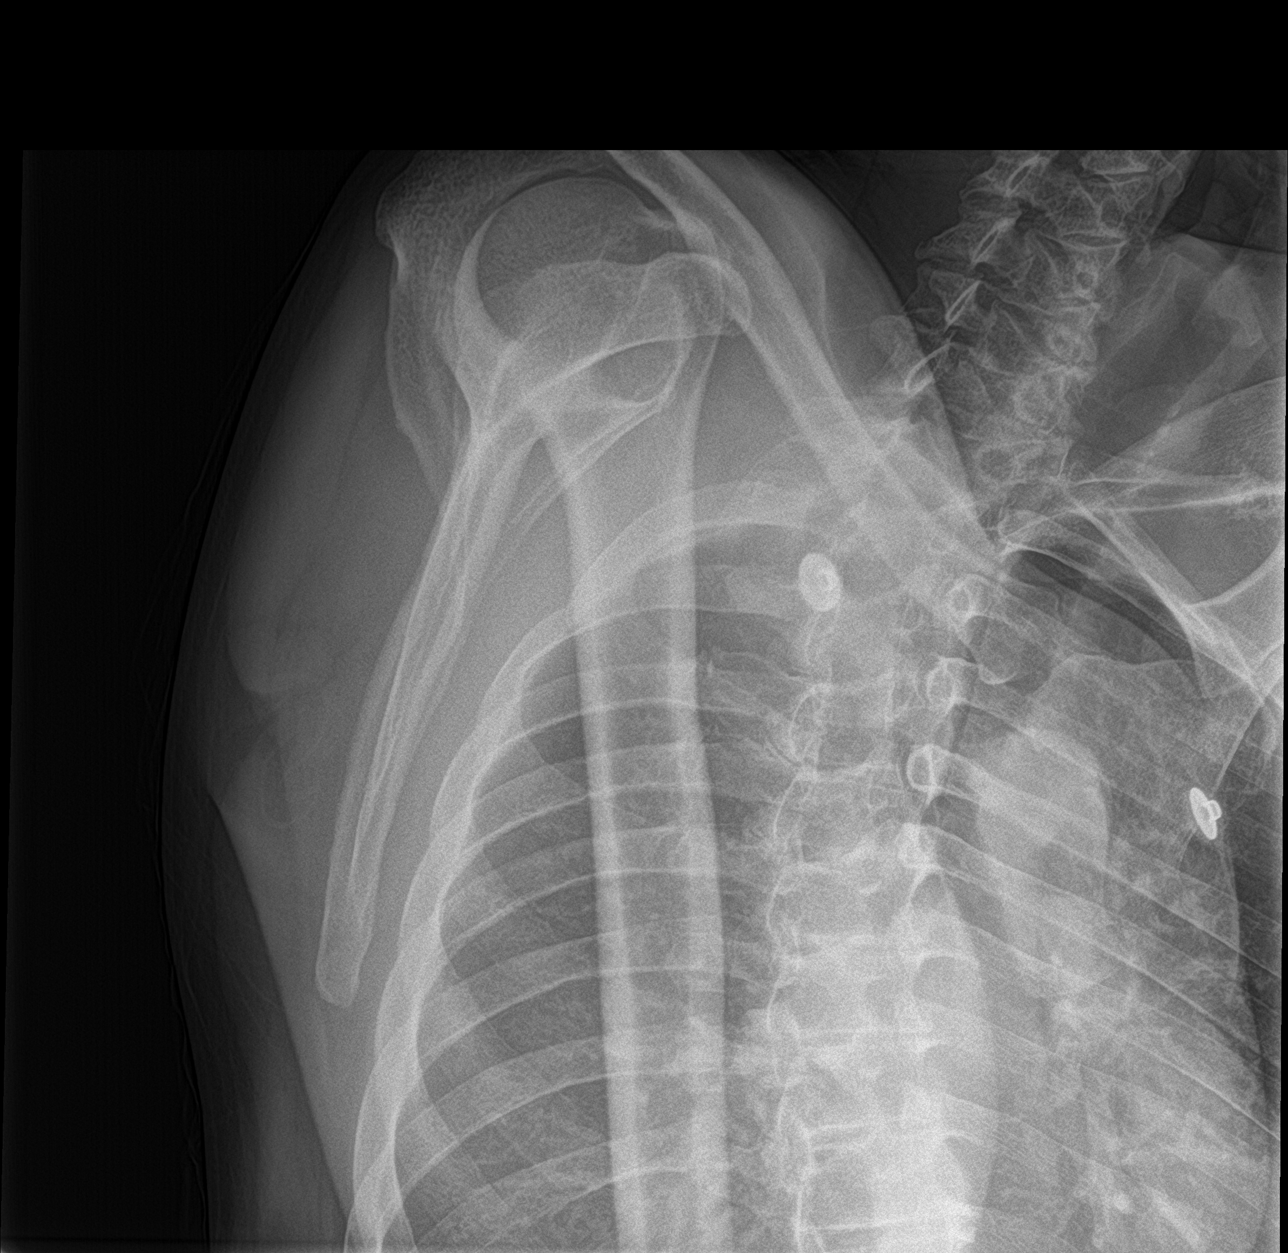

[3 of 3 positions shown; findings below may reference images not displayed]

FINDINGS: There is no evidence of fracture or dislocation. There is no
evidence of arthropathy or other focal bone abnormality. Soft
tissues are unremarkable.
IMPRESSION: Negative.

## 2021-02-23 ENCOUNTER — Ambulatory Visit
Admission: EM | Admit: 2021-02-23 | Discharge: 2021-02-23 | Disposition: A | Discharge status: Home / Self Care | Payer: BC Managed Care – PPO | Attending: Urgent Care | Admitting: Urgent Care

## 2021-02-23 ENCOUNTER — Other Ambulatory Visit: Payer: Self-pay

## 2021-02-23 DIAGNOSIS — Z0189 Encounter for other specified special examinations: Secondary | ICD-10-CM

## 2021-02-23 DIAGNOSIS — Z20822 Contact with and (suspected) exposure to covid-19: Secondary | ICD-10-CM | POA: Diagnosis not present

## 2021-02-23 NOTE — ED Triage Notes (Signed)
Pt reports he was exposed to COVID as his wife tested positive on 02/22/2021. Denies any symptoms. Pt requested COVID test.

## 2021-02-23 NOTE — ED Provider Notes (Signed)
Nurse visit only.    Wallis Bamberg, PA-C 02/23/21 1521

## 2021-02-24 LAB — SARS-COV-2, NAA 2 DAY TAT

## 2021-02-24 LAB — NOVEL CORONAVIRUS, NAA: SARS-CoV-2, NAA: NOT DETECTED

## 2021-02-26 ENCOUNTER — Emergency Department (HOSPITAL_COMMUNITY): Payer: BC Managed Care – PPO

## 2021-02-26 ENCOUNTER — Encounter (HOSPITAL_COMMUNITY): Payer: Self-pay | Admitting: Emergency Medicine

## 2021-02-26 ENCOUNTER — Emergency Department (HOSPITAL_COMMUNITY)
Admission: EM | Admit: 2021-02-26 | Discharge: 2021-02-26 | Disposition: A | Discharge status: Home / Self Care | Payer: BC Managed Care – PPO | Attending: Emergency Medicine | Admitting: Emergency Medicine

## 2021-02-26 ENCOUNTER — Other Ambulatory Visit: Payer: Self-pay

## 2021-02-26 DIAGNOSIS — R110 Nausea: Secondary | ICD-10-CM | POA: Diagnosis not present

## 2021-02-26 DIAGNOSIS — R0789 Other chest pain: Secondary | ICD-10-CM | POA: Diagnosis not present

## 2021-02-26 DIAGNOSIS — K4090 Unilateral inguinal hernia, without obstruction or gangrene, not specified as recurrent: Secondary | ICD-10-CM | POA: Insufficient documentation

## 2021-02-26 DIAGNOSIS — R079 Chest pain, unspecified: Secondary | ICD-10-CM | POA: Diagnosis not present

## 2021-02-26 DIAGNOSIS — R112 Nausea with vomiting, unspecified: Secondary | ICD-10-CM | POA: Diagnosis not present

## 2021-02-26 DIAGNOSIS — R1012 Left upper quadrant pain: Secondary | ICD-10-CM | POA: Diagnosis not present

## 2021-02-26 DIAGNOSIS — R109 Unspecified abdominal pain: Secondary | ICD-10-CM | POA: Diagnosis not present

## 2021-02-26 LAB — CBC WITH DIFFERENTIAL/PLATELET
Abs Immature Granulocytes: 0.01 10*3/uL (ref 0.00–0.07)
Basophils Absolute: 0 10*3/uL (ref 0.0–0.1)
Basophils Relative: 1 %
Eosinophils Absolute: 0.1 10*3/uL (ref 0.0–0.5)
Eosinophils Relative: 2 %
HCT: 40.9 % (ref 39.0–52.0)
Hemoglobin: 13.2 g/dL (ref 13.0–17.0)
Immature Granulocytes: 0 %
Lymphocytes Relative: 40 %
Lymphs Abs: 1.9 10*3/uL (ref 0.7–4.0)
MCH: 30.6 pg (ref 26.0–34.0)
MCHC: 32.3 g/dL (ref 30.0–36.0)
MCV: 94.7 fL (ref 80.0–100.0)
Monocytes Absolute: 0.3 10*3/uL (ref 0.1–1.0)
Monocytes Relative: 6 %
Neutro Abs: 2.4 10*3/uL (ref 1.7–7.7)
Neutrophils Relative %: 51 %
Platelets: 309 10*3/uL (ref 150–400)
RBC: 4.32 MIL/uL (ref 4.22–5.81)
RDW: 13.5 % (ref 11.5–15.5)
WBC: 4.8 10*3/uL (ref 4.0–10.5)
nRBC: 0 % (ref 0.0–0.2)

## 2021-02-26 LAB — COMPREHENSIVE METABOLIC PANEL
ALT: 19 U/L (ref 0–44)
AST: 23 U/L (ref 15–41)
Albumin: 3.8 g/dL (ref 3.5–5.0)
Alkaline Phosphatase: 76 U/L (ref 38–126)
Anion gap: 5 (ref 5–15)
BUN: 11 mg/dL (ref 8–23)
CO2: 27 mmol/L (ref 22–32)
Calcium: 9 mg/dL (ref 8.9–10.3)
Chloride: 106 mmol/L (ref 98–111)
Creatinine, Ser: 0.85 mg/dL (ref 0.61–1.24)
GFR, Estimated: >60 mL/min (ref >60)
Glucose, Bld: 96 mg/dL (ref 70–99)
Potassium: 4 mmol/L (ref 3.5–5.1)
Sodium: 138 mmol/L (ref 135–145)
Total Bilirubin: 0.2 mg/dL — ABNORMAL LOW (ref 0.3–1.2)
Total Protein: 7.1 g/dL (ref 6.5–8.1)

## 2021-02-26 LAB — LIPASE, BLOOD: Lipase: 30 U/L (ref 11–51)

## 2021-02-26 LAB — TROPONIN I (HIGH SENSITIVITY)
Troponin I (High Sensitivity): 3 ng/L (ref <18)
Troponin I (High Sensitivity): 4 ng/L (ref <18)

## 2021-02-26 MED ORDER — MORPHINE SULFATE (PF) 4 MG/ML IV SOLN
4.0000 mg | Freq: Once | INTRAVENOUS | Status: AC
  Administered 2021-02-26: 4 mg via INTRAVENOUS
  Filled 2021-02-26: qty 1

## 2021-02-26 MED ORDER — ONDANSETRON HCL 4 MG/2ML IJ SOLN
4.0000 mg | Freq: Once | INTRAMUSCULAR | Status: AC
  Administered 2021-02-26: 4 mg via INTRAVENOUS
  Filled 2021-02-26: qty 2

## 2021-02-26 MED ORDER — ONDANSETRON HCL 4 MG PO TABS: 4.0000 mg | ORAL_TABLET | Freq: Four times a day (QID) | ORAL | 0 refills | Status: DC

## 2021-02-26 MED ORDER — HYDROCODONE-ACETAMINOPHEN 5-325 MG PO TABS: 1.0000 | ORAL_TABLET | Freq: Four times a day (QID) | ORAL | 0 refills | Status: DC | PRN

## 2021-02-26 MED ORDER — IOHEXOL 300 MG/ML  SOLN
100.0000 mL | Freq: Once | INTRAMUSCULAR | Status: AC | PRN
  Administered 2021-02-26: 100 mL via INTRAVENOUS

## 2021-02-26 NOTE — ED Triage Notes (Signed)
Pt to the ED with complaints of abdominal "queasiness" for the past week.  Pt states he has felt weak and he had a negative covid test at [facility] on 02/23/2021.

## 2021-02-26 NOTE — Discharge Instructions (Signed)
Your lab tests and your CAT scan today along with your EKG and cardiac testing are all normal and reassuring.  It is unclear clear at this point the reason for your symptoms.  However you have been prescribed some medication to help you with the nausea and the pain.  Call Dr. Margo Aye for recheck of your symptoms within 1 week if not completely resolved.  In the interim if you have any new or worsening symptoms return here for further evaluation.  Do not drive within 4 hours of taking hydrocodone as this medication will make you drowsy.

## 2021-02-26 NOTE — ED Provider Notes (Addendum)
[facility] EMERGENCY DEPARTMENT Provider Note   CSN: [id number] Arrival date & time: 02/26/21  1012     History  Chief Complaint  Patient presents with   Abdominal Pain    Tyler Chang is a 61 y.o. male with no significant past medical history, surgical history of appendicitis presenting for evaluation of 1 week history of nausea without emesis which has been fairly constant along with pain in his left upper abdomen.  His pain is waxing and waning in character, described as dull to occasional sharp pain it radiates into his left lateral chest region.  He denies shortness of breath, palpitations, also denies fevers or chills, no diarrhea, constipation or dysuria.  He was seen at urgent care on January 30 and had a negative COVID test completed, this was done primarily because his wife currently has COVID 19.  He denies any respiratory symptoms.  He has found no alleviators for symptoms.  The history is provided by the patient.      Home Medications Prior to Admission medications   Medication Sig Start Date End Date Taking? Authorizing Provider  HYDROcodone-acetaminophen (NORCO/VICODIN) 5-325 MG tablet Take 1 tablet by mouth every 6 (six) hours as needed. 02/26/21  Yes Javontae Marlette, Almyra Free, PA-C  ondansetron (ZOFRAN) 4 MG tablet Take 1 tablet (4 mg total) by mouth every 6 (six) hours. 02/26/21  Yes Carlyle Mcelrath, Almyra Free, PA-C  diclofenac Sodium (VOLTAREN) 1 % GEL Apply 2 g topically 4 (four) times daily as needed. 10/03/19   Long, Wonda Old[employer], MD  naproxen sodium (ALEVE) 220 MG tablet Take 220 mg by mouth 2 (two) times daily as needed.    [provider]  traMADol (ULTRAM) 50 MG tablet Take 1 tablet (50 mg total) by mouth every 6 (six) hours as needed. 12/27/18   Aviva Signs, MD      Allergies    Patient has no known allergies.    Review of Systems   Review of Systems  Constitutional:  Negative for fever.  HENT:  Negative for congestion.   Eyes: Negative.   Respiratory:  Negative for chest  tightness and shortness of breath.   Cardiovascular:  Positive for chest pain.  Gastrointestinal:  Positive for abdominal pain and nausea. Negative for diarrhea and vomiting.  Genitourinary: Negative.   Musculoskeletal:  Negative for arthralgias, joint swelling and neck pain.  Skin: Negative.  Negative for rash and wound.  Neurological:  Negative for dizziness, weakness, light-headedness, numbness and headaches.  Psychiatric/Behavioral: Negative.    All other systems reviewed and are negative.  Physical Exam Updated Vital Signs BP (!) 135/92    Pulse (!) 53    Temp 97.7 F (36.5 C) (Oral)    Resp 11    Ht $R'5\' 4"'Vn$  (1.626 m)    Wt 67.6 kg    SpO2 98%    BMI 25.58 kg/m  Physical Exam Vitals and nursing note reviewed.  Constitutional:      Appearance: He is well-developed.  HENT:     Head: Normocephalic and atraumatic.  Eyes:     Conjunctiva/sclera: Conjunctivae normal.  Cardiovascular:     Rate and Rhythm: Normal rate and regular rhythm.     Heart sounds: Normal heart sounds.  Pulmonary:     Effort: Pulmonary effort is normal.     Breath sounds: Normal breath sounds. No wheezing.  Chest:     Comments: No chest wall tenderness to palpation. Abdominal:     General: Bowel sounds are normal. There is no distension.  Palpations: Abdomen is soft. There is no fluid wave or mass.     Tenderness: There is abdominal tenderness in the left upper quadrant. There is no guarding or rebound.     Comments: No CVA tenderness.  Musculoskeletal:        General: Normal range of motion.     Cervical back: Normal range of motion.  Skin:    General: Skin is warm and dry.  Neurological:     Mental Status: He is alert.    ED Results / Procedures / Treatments   Labs (all labs ordered are listed, but only abnormal results are displayed) Labs Reviewed  COMPREHENSIVE METABOLIC PANEL - Abnormal; Notable for the following components:      Result Value   Total Bilirubin 0.2 (*)    All other  components within normal limits  CBC WITH DIFFERENTIAL/PLATELET  LIPASE, BLOOD  TROPONIN I (HIGH SENSITIVITY)  TROPONIN I (HIGH SENSITIVITY)    EKG ED ECG REPORT   Date: 02/26/2021  Rate: 55  Rhythm: normal sinus rhythm  QRS Axis: normal  Intervals: normal  ST/T Wave abnormalities: nonspecific ST changes  Conduction Disutrbances:none  Narrative Interpretation:   Old EKG Reviewed: unchanged  I have personally reviewed the EKG tracing and agree with the computerized printout as noted.   Radiology CT ABDOMEN PELVIS W CONTRAST  Result Date: 02/26/2021 CLINICAL DATA:  Left upper quadrant abdominal pain, nausea, weakness. EXAM: CT ABDOMEN AND PELVIS WITH CONTRAST TECHNIQUE: Multidetector CT imaging of the abdomen and pelvis was performed using the standard protocol following bolus administration of intravenous contrast. RADIATION DOSE REDUCTION: This exam was performed according to the departmental dose-optimization program which includes automated exposure control, adjustment of the mA and/or kV according to patient size and/or use of iterative reconstruction technique. CONTRAST:  120mL OMNIPAQUE IOHEXOL 300 MG/ML  SOLN COMPARISON:  12/26/2018 FINDINGS: Lower chest: No acute abnormality. Hepatobiliary: No focal liver abnormality is seen. No gallstones, gallbladder wall thickening, or biliary dilatation. Pancreas: Unremarkable Spleen: Unremarkable Adrenals/Urinary Tract: Adrenal glands are unremarkable. Kidneys are normal, without renal calculi, focal lesion, or hydronephrosis. Bladder is unremarkable. Stomach/Bowel: The sigmoid colon is redundant. The stomach, small bowel, and large bowel, however, are otherwise unremarkable. There is no evidence of obstruction or focal inflammation. Appendectomy has been performed. No free intraperitoneal gas or fluid. Vascular/Lymphatic: No significant vascular findings are present. No enlarged abdominal or pelvic lymph nodes. Reproductive: Prostate is  unremarkable. Other: Small fat containing left inguinal hernia is noted. Rectum unremarkable. Musculoskeletal: Right femoral ORIF has been performed, partially visualized. 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 Superimposed degenerative changes are seen at the lumbosacral junction. No acute bone abnormality. IMPRESSION: No acute intra-abdominal pathology identified. No definite radiographic explanation for the patient's reported symptoms. Small left fat containing inguinal hernia. Electronically Signed   By: Fidela Salisbury M.D.   On: 02/26/2021 17:19   DG Chest Port 1 View  Result Date: 02/26/2021 CLINICAL DATA:  Left lateral chest pain for 1 week. EXAM: PORTABLE CHEST 1 VIEW COMPARISON:  01/28/2016 and 09/02/2013 FINDINGS: Normal sized heart. Clear lungs. Bilateral nipple shadows. Thoracic spine degenerative changes. IMPRESSION: No acute abnormality. Electronically Signed   By: Claudie Revering M.D.   On: 02/26/2021 13:56    Procedures Procedures    Medications Ordered in ED Medications  ondansetron (ZOFRAN) injection 4 mg (4 mg Intravenous Given 02/26/21 1357)  morphine (PF) 4 MG/ML injection 4 mg (4 mg Intravenous Given  02/26/21 1358)  iohexol (OMNIPAQUE) 300 MG/ML solution 100 mL (100 mLs Intravenous Contrast Given 02/26/21 1636)    ED Course/ Medical Decision Making/ A&P                           Medical Decision Making Patient with nausea, no emesis, left upper quadrant pain of unclear etiology.  He denies chest pain specifically, but states at times this pain radiates into his left lateral chest area.  He denies shortness of breath, cough, fevers, palpitations.  He denies acid reflux symptoms.  His wife is currently recovering from COVID-19, she has respiratory symptoms.  He was COVID screened 3 days ago and was negative.  Differential diagnosis includes atypical ACS, lung pathology such as pneumonia, viral infection, acute pancreatitis,  small bowel obstruction, intra-abdominal infection.  Amount and/or Complexity of Data Reviewed Labs: ordered.    Details: Labs including a CBC with differential, c-Met, lipase, delta troponins x2.  All negative. Radiology: ordered.    Details: Chest x-ray is negative for acute cardiopulmonary disease, no pneumonia.  CT abdomen and pelvis were also negative, there is no signs of any acute pancreatitis, bowel obstruction, other intra-abdominal process.  Of note, there is a small left fat-containing inguinal hernia.  He denies pain at the site. ECG/medicine tests: ordered and independent interpretation performed.    Details: Stable in comparison to EKG dated 03/16/2020  Risk Prescription drug management. Decision regarding hospitalization. Risk Details: There is no indication for admission at this time.  Patients labs are stable including a normal lipase, he denies significant EtOH use.  CT imaging is negative including no pancreatitis, no bowel obstruction or other intra-abdominal process.  It is possible that he has an acute gastritis, although he denies any acid reflux symptoms.  He was symptom-free at time of discharge.  He has no guarding on repeat abdominal exam.  He was given Zofran, a few hydrocodone tablets for symptom relief.  He was advised follow-up with his PCP in a week if symptoms are not resolved, returning here sooner for any worsening or new symptoms, particularly uncontrolled vomiting, worse pain or development of fever.  He was COVID screen 3 days ago and was negative, there is no indication to repeat this testing today.           Final Clinical Impression(s) / ED Diagnoses Final diagnoses:  Left upper quadrant abdominal pain  Nausea    Rx / DC Orders ED Discharge Orders          Ordered    ondansetron (ZOFRAN) 4 MG tablet  Every 6 hours        02/26/21 1756    HYDROcodone-acetaminophen (NORCO/VICODIN) 5-325 MG tablet  Every 6 hours PRN        02/26/21 1756               Evalee Jefferson, PA-C 02/26/21 1810    Evalee Jefferson, PA-C 02/26/21 1819    Evalee Jefferson, PA-C 02/26/21 1820    Fredia Sorrow, MD 02/27/21 [phone]

## 2021-05-28 ENCOUNTER — Ambulatory Visit: Payer: BC Managed Care – PPO | Admitting: Nurse Practitioner

## 2021-09-12 DIAGNOSIS — Y92410 Unspecified street and highway as the place of occurrence of the external cause: Secondary | ICD-10-CM | POA: Diagnosis not present

## 2021-09-12 DIAGNOSIS — R0789 Other chest pain: Secondary | ICD-10-CM | POA: Diagnosis not present

## 2021-09-12 DIAGNOSIS — Z041 Encounter for examination and observation following transport accident: Secondary | ICD-10-CM | POA: Diagnosis not present

## 2021-09-12 DIAGNOSIS — R079 Chest pain, unspecified: Secondary | ICD-10-CM | POA: Diagnosis not present

## 2021-09-12 DIAGNOSIS — M25532 Pain in left wrist: Secondary | ICD-10-CM | POA: Diagnosis not present

## 2021-09-29 DIAGNOSIS — Z791 Long term (current) use of non-steroidal anti-inflammatories (NSAID): Secondary | ICD-10-CM | POA: Diagnosis not present

## 2021-09-29 DIAGNOSIS — M19042 Primary osteoarthritis, left hand: Secondary | ICD-10-CM | POA: Diagnosis not present

## 2021-09-29 DIAGNOSIS — S6992XA Unspecified injury of left wrist, hand and finger(s), initial encounter: Secondary | ICD-10-CM | POA: Diagnosis not present

## 2021-09-29 DIAGNOSIS — M25532 Pain in left wrist: Secondary | ICD-10-CM | POA: Diagnosis not present

## 2021-10-26 DIAGNOSIS — S6992XA Unspecified injury of left wrist, hand and finger(s), initial encounter: Secondary | ICD-10-CM | POA: Diagnosis not present

## 2021-10-26 DIAGNOSIS — M19042 Primary osteoarthritis, left hand: Secondary | ICD-10-CM | POA: Diagnosis not present

## 2021-10-26 DIAGNOSIS — S6992XD Unspecified injury of left wrist, hand and finger(s), subsequent encounter: Secondary | ICD-10-CM | POA: Diagnosis not present

## 2021-12-21 DIAGNOSIS — S62002A Unspecified fracture of navicular [scaphoid] bone of left wrist, initial encounter for closed fracture: Secondary | ICD-10-CM | POA: Diagnosis not present

## 2021-12-21 DIAGNOSIS — X58XXXD Exposure to other specified factors, subsequent encounter: Secondary | ICD-10-CM | POA: Diagnosis not present

## 2021-12-21 DIAGNOSIS — M7989 Other specified soft tissue disorders: Secondary | ICD-10-CM | POA: Diagnosis not present

## 2021-12-21 DIAGNOSIS — S62002D Unspecified fracture of navicular [scaphoid] bone of left wrist, subsequent encounter for fracture with routine healing: Secondary | ICD-10-CM | POA: Diagnosis not present

## 2022-03-03 ENCOUNTER — Emergency Department (HOSPITAL_COMMUNITY): Payer: BC Managed Care – PPO

## 2022-03-03 ENCOUNTER — Emergency Department (HOSPITAL_COMMUNITY)
Admission: EM | Admit: 2022-03-03 | Discharge: 2022-03-03 | Disposition: A | Discharge status: Home / Self Care | Payer: BC Managed Care – PPO | Attending: Emergency Medicine | Admitting: Emergency Medicine

## 2022-03-03 ENCOUNTER — Other Ambulatory Visit: Payer: Self-pay

## 2022-03-03 ENCOUNTER — Encounter (HOSPITAL_COMMUNITY): Payer: Self-pay | Admitting: *Deleted

## 2022-03-03 DIAGNOSIS — R519 Headache, unspecified: Secondary | ICD-10-CM | POA: Diagnosis not present

## 2022-03-03 DIAGNOSIS — R42 Dizziness and giddiness: Secondary | ICD-10-CM | POA: Insufficient documentation

## 2022-03-03 LAB — CBC WITH DIFFERENTIAL/PLATELET
Abs Immature Granulocytes: 0.01 10*3/uL (ref 0.00–0.07)
Basophils Absolute: 0 10*3/uL (ref 0.0–0.1)
Basophils Relative: 1 %
Eosinophils Absolute: 0 10*3/uL (ref 0.0–0.5)
Eosinophils Relative: 1 %
HCT: 40.8 % (ref 39.0–52.0)
Hemoglobin: 13.3 g/dL (ref 13.0–17.0)
Immature Granulocytes: 0 %
Lymphocytes Relative: 30 %
Lymphs Abs: 1 10*3/uL (ref 0.7–4.0)
MCH: 30.6 pg (ref 26.0–34.0)
MCHC: 32.6 g/dL (ref 30.0–36.0)
MCV: 94 fL (ref 80.0–100.0)
Monocytes Absolute: 0.9 10*3/uL (ref 0.1–1.0)
Monocytes Relative: 27 %
Neutro Abs: 1.4 10*3/uL — ABNORMAL LOW (ref 1.7–7.7)
Neutrophils Relative %: 41 %
Platelets: 218 10*3/uL (ref 150–400)
RBC: 4.34 MIL/uL (ref 4.22–5.81)
RDW: 13.1 % (ref 11.5–15.5)
WBC: 3.4 10*3/uL — ABNORMAL LOW (ref 4.0–10.5)
nRBC: 0 % (ref 0.0–0.2)

## 2022-03-03 LAB — COMPREHENSIVE METABOLIC PANEL
ALT: 17 U/L (ref 0–44)
AST: 25 U/L (ref 15–41)
Albumin: 4.1 g/dL (ref 3.5–5.0)
Alkaline Phosphatase: 76 U/L (ref 38–126)
Anion gap: 7 (ref 5–15)
BUN: 13 mg/dL (ref 8–23)
CO2: 25 mmol/L (ref 22–32)
Calcium: 8.9 mg/dL (ref 8.9–10.3)
Chloride: 103 mmol/L (ref 98–111)
Creatinine, Ser: 1.05 mg/dL (ref 0.61–1.24)
GFR, Estimated: >60 mL/min (ref >60)
Glucose, Bld: 88 mg/dL (ref 70–99)
Potassium: 4 mmol/L (ref 3.5–5.1)
Sodium: 135 mmol/L (ref 135–145)
Total Bilirubin: 0.3 mg/dL (ref 0.3–1.2)
Total Protein: 7.8 g/dL (ref 6.5–8.1)

## 2022-03-03 MED ORDER — KETOROLAC TROMETHAMINE 30 MG/ML IJ SOLN
30.0000 mg | Freq: Once | INTRAMUSCULAR | Status: AC
Start: 2022-03-03 — End: 2022-03-03
  Administered 2022-03-03: 30 mg via INTRAMUSCULAR
  Filled 2022-03-03: qty 1

## 2022-03-03 NOTE — ED Triage Notes (Signed)
Pt reports when waking this morning at 0730 he had a dizziness and a headache. LKW 0000.

## 2022-03-03 NOTE — ED Provider Notes (Signed)
[facility] Provider Note   CSN: [id number] Arrival date & time: 03/03/22  1603     History Chief Complaint  Patient presents with   Headache    Tyler Chang is a 62 y.o. male.  Patient presents to the emergency department complaints of headaches. Patient reports that headache with dizziness occurred this morning and has improved somewhat throughout the course of the day. Denies any other symptoms such as chest pain, shortness of breath, photosensitivity, or abdominal discomfort. Patient not currently on blood thinners. Others reports no chronic health problems.   Headache Associated symptoms: no abdominal pain, no fever, no nausea, no vomiting and no weakness        Home Medications Prior to Admission medications   Not on File      Allergies    Patient has no known allergies.    Review of Systems   Review of Systems  Constitutional:  Negative for chills, diaphoresis and fever.  Respiratory:  Negative for chest tightness and shortness of breath.   Cardiovascular:  Negative for chest pain.  Gastrointestinal:  Negative for abdominal pain, nausea and vomiting.  Neurological:  Positive for headaches. Negative for weakness and light-headedness.  All other systems reviewed and are negative.   Physical Exam Updated Vital Signs BP (!) 153/96   Pulse (!) 57   Temp 97.8 F (36.6 C) (Oral)   Resp 16   Ht 5' 4"$  (1.626 m)   Wt 63.5 kg   SpO2 99%   BMI 24.03 kg/m  Physical Exam Vitals and nursing note reviewed.  Constitutional:      Appearance: He is well-developed.  HENT:     Head: Normocephalic and atraumatic.  Eyes:     Pupils: Pupils are equal, round, and reactive to light.  Cardiovascular:     Rate and Rhythm: Normal rate and regular rhythm.     Heart sounds: Normal heart sounds.  Pulmonary:     Effort: Pulmonary effort is normal.     Breath sounds: Normal breath sounds.  Abdominal:     General: Bowel sounds are  normal.     Palpations: Abdomen is soft.  Musculoskeletal:        General: Normal range of motion.  Skin:    General: Skin is warm and dry.     Capillary Refill: Capillary refill takes less than 2 seconds.  Neurological:     Mental Status: He is alert.     ED Results / Procedures / Treatments   Labs (all labs ordered are listed, but only abnormal results are displayed) Labs Reviewed  CBC WITH DIFFERENTIAL/PLATELET - Abnormal; Notable for the following components:      Result Value   WBC 3.4 (*)    Neutro Abs 1.4 (*)    All other components within normal limits  COMPREHENSIVE METABOLIC PANEL    EKG None  Radiology CT Head Wo Contrast  Result Date: 03/03/2022 CLINICAL DATA:  New onset headache, dizziness. EXAM: CT HEAD WITHOUT CONTRAST TECHNIQUE: Contiguous axial images were obtained from the base of the skull through the vertex without intravenous contrast. RADIATION DOSE REDUCTION: This exam was performed according to the departmental dose-optimization program which includes automated exposure control, adjustment of the mA and/or kV according to patient size and/or use of iterative reconstruction technique. COMPARISON:  None Available. FINDINGS: Brain: No evidence of acute infarction, hemorrhage, hydrocephalus, extra-axial collection or mass lesion/mass effect. Vascular: No hyperdense vessel or unexpected calcification. Skull: Normal. Negative  for fracture or focal lesion. Sinuses/Orbits: Mild right sphenoid sinus disease. Other: None. IMPRESSION: 1. No acute intracranial abnormality. Electronically Signed   By: Zerita Boers M.D.   On: 03/03/2022 19:49    Procedures Procedures   Medications Ordered in ED Medications  ketorolac (TORADOL) 30 MG/ML injection 30 mg (30 mg Intramuscular Given 03/03/22 2216)    ED Course/ Medical Decision Making/ A&P                           Medical Decision Making Risk Prescription drug management.   This patient presents to the ED for  concern of headache.  Differential diagnosis includes migraine, intracranial abnormality, brain mass, viral URI   Lab Tests:  I Ordered, and personally interpreted labs.  The pertinent results include: Normal CBC and CMP   Imaging Studies ordered:  I ordered imaging studies including CT head I independently visualized and interpreted imaging which showed no intracranial abnormalities I agree with the radiologist interpretation   Medicines ordered and prescription drug management:  I ordered medication including Toradol for headache Reevaluation of the patient after these medicines showed that the patient improved I have reviewed the patients home medicines and have made adjustments as needed   Problem List / ED Course:  Patient presented to the emergency department complaints of a headache for several hours prior to arrival to emergency department.  He reports that he woke up this morning with a headache and dizziness.  Patient denies any nausea, photosensitivity, sensitivity to sounds.  Given patient's presentation with the associated dizziness, a head CT and basic labs were performed which were all reassuring at this time.  Advised patient about results and administered a dose of Toradol to patient.  He reported improvement with the Toradol.  Advised patient to monitor symptoms for the next 2 days for any worsening of the headache and to follow-up with his primary care provider for further evaluation if headaches are persistent.  Patient was agreeable to treatment plan and discharge home.  All questions answered prior to patient discharge.  Final Clinical Impression(s) / ED Diagnoses Final diagnoses:  Acute nonintractable headache, unspecified headache type    Rx / DC Orders ED Discharge Orders     None         Luvenia Heller, PA-C 03/05/22 1731    Audley Hose, MD 03/06/22 1655

## 2022-03-03 NOTE — ED Provider Triage Note (Signed)
Emergency Medicine Provider Triage Evaluation Note  Tyler Chang , a 62 y.o. male  was evaluated in triage.  Pt complains of headache and dizziness.  Started this morning at 730.  States his last known well was at midnight.  Endorses loss of balance and room spinning sensation.  Headache pain localized in the forehead.  Denies photophobia and phonophobia.  Review of Systems  Positive: See above Negative: See above  Physical Exam  BP (!) 128/94   Pulse 64   Temp 97.8 F (36.6 C) (Oral)   Resp 18   Ht 5' 4"$  (1.626 m)   Wt 63.5 kg   SpO2 99%   BMI 24.03 kg/m  Gen:   Awake, no distress   Resp:  Normal effort  MSK:   Moves extremities without difficulty  Other:  No FND  Medical Decision Making  Medically screening exam initiated at 6:47 PM.  Appropriate orders placed.  OSHAE HUCKLEBY was informed that the remainder of the evaluation will be completed by another provider, this initial triage assessment does not replace that evaluation, and the importance of remaining in the ED until their evaluation is complete.  Work up started   Harriet Pho, PA-C 03/03/22 1850

## 2022-03-03 NOTE — Discharge Instructions (Signed)
You were seen in the ER for a headache. A dose of toradol was given to you which appeared to improve your headache. Please return to the ER if your headache is not improving, you experiencing vision changes, or develop severe vomiting.

## 2022-05-14 IMAGING — CT CT ABD-PELV W/ CM
2 of 4 series · 16 of 46 positions shown, 18 images · IV contrast (Omnipaque or Isovue)
Comparison: 12/26/2018

CLINICAL DATA: Left upper quadrant abdominal pain, nausea,
weakness.

EXAM:
CT ABDOMEN AND PELVIS WITH CONTRAST
TECHNIQUE: Multidetector CT imaging of the abdomen and pelvis was performed
using the standard protocol following bolus administration of
intravenous contrast.

[Series 2: axial st · axial · 0.66mm/px · z∈[-721,-281]mm · 13 of 98 slices shown, 15 images]
[im 5/98  soft-tissue]
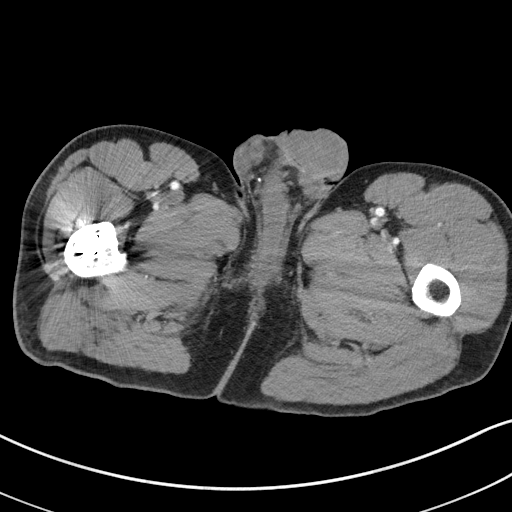
[im 5/98  bone]
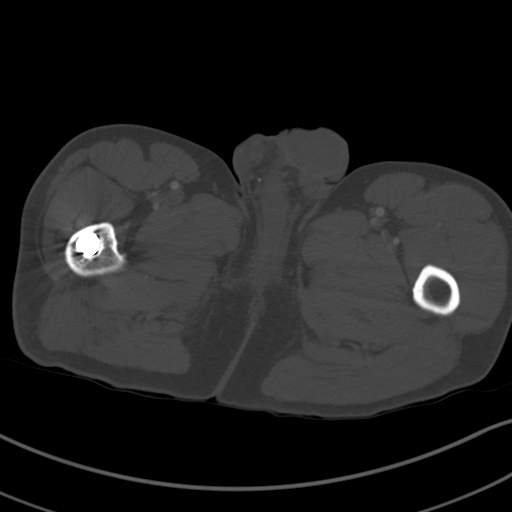
[im 13/98  soft-tissue]
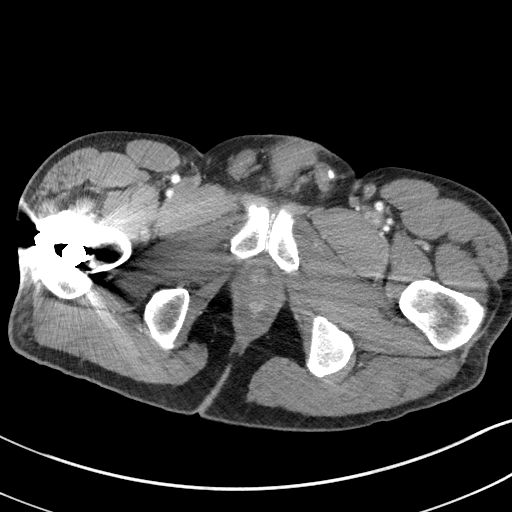
[im 22/98  soft-tissue]
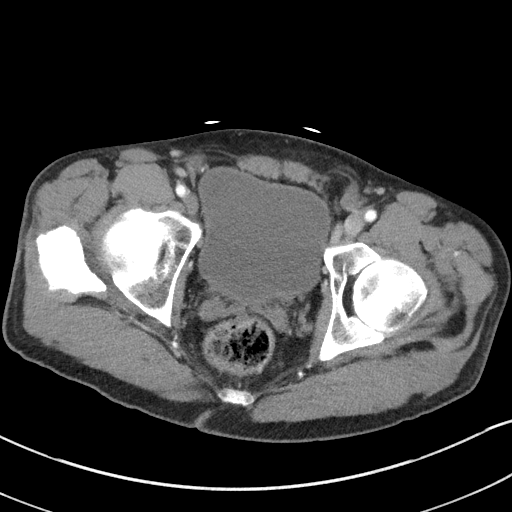
[im 26/98  soft-tissue]
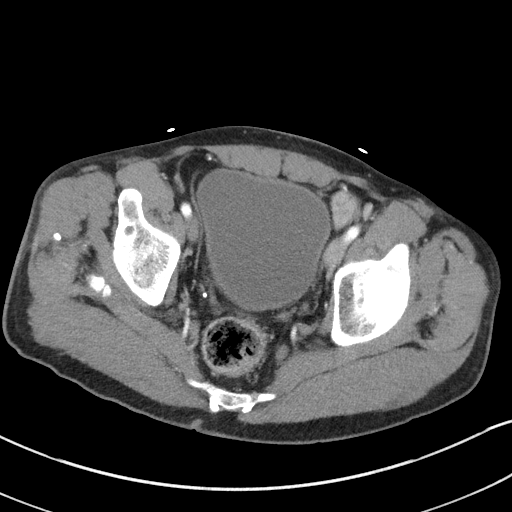
[im 34/98  soft-tissue]
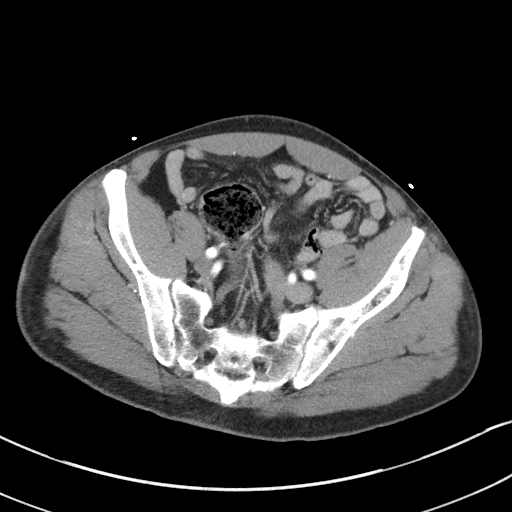
[im 43/98  soft-tissue]
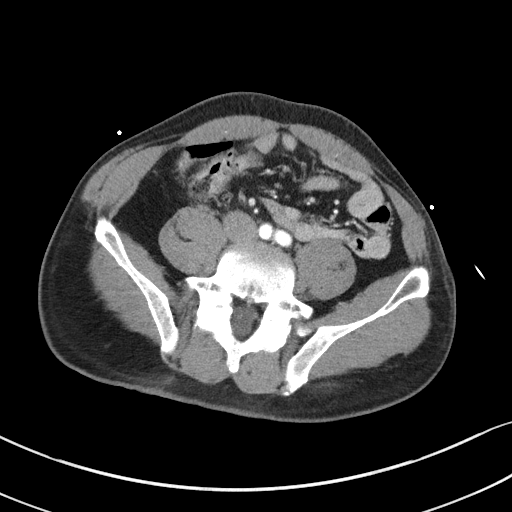
[im 51/98  soft-tissue]
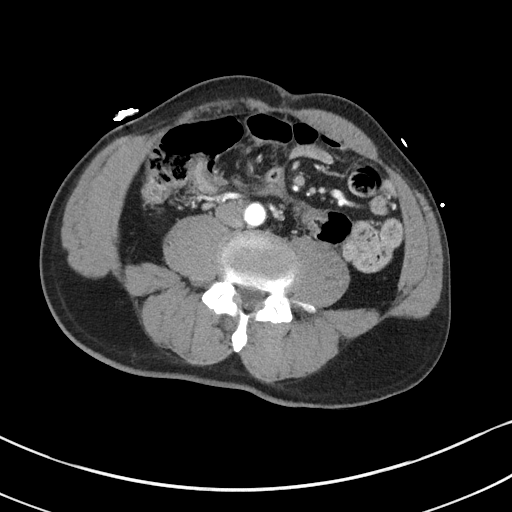
[im 55/98  soft-tissue]
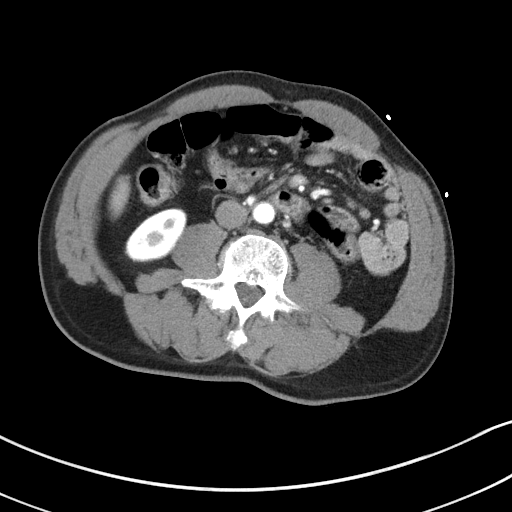
[im 64/98  soft-tissue]
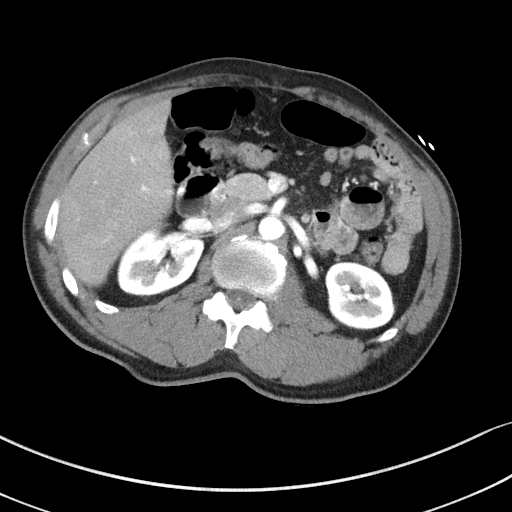
[im 64/98  bone]
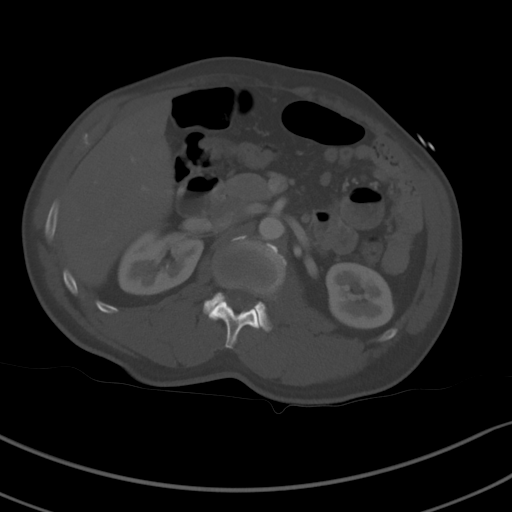
[im 72/98  soft-tissue]
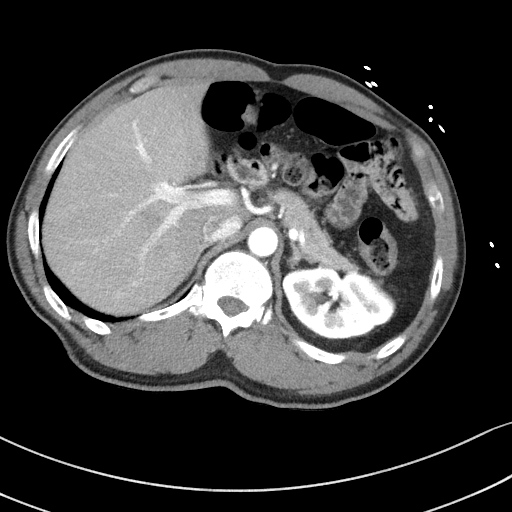
[im 76/98  soft-tissue]
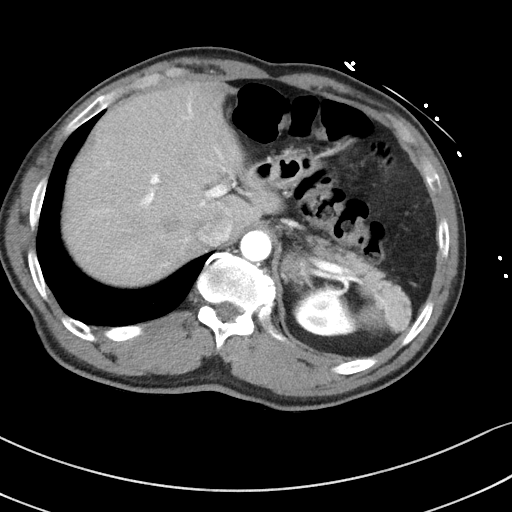
[im 85/98  soft-tissue]
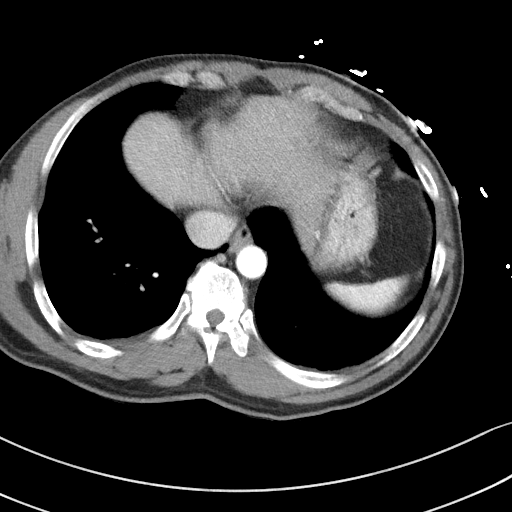
[im 93/98  soft-tissue]
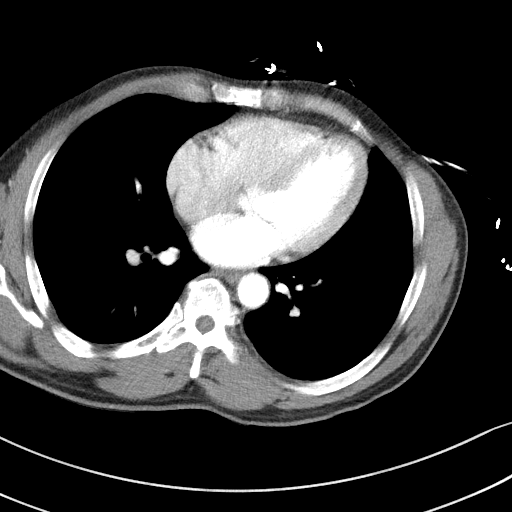

[Series 6: coronal st · coronal · 0.82mm/px · 3 of 117 slices shown]
[im 39/117  soft-tissue]
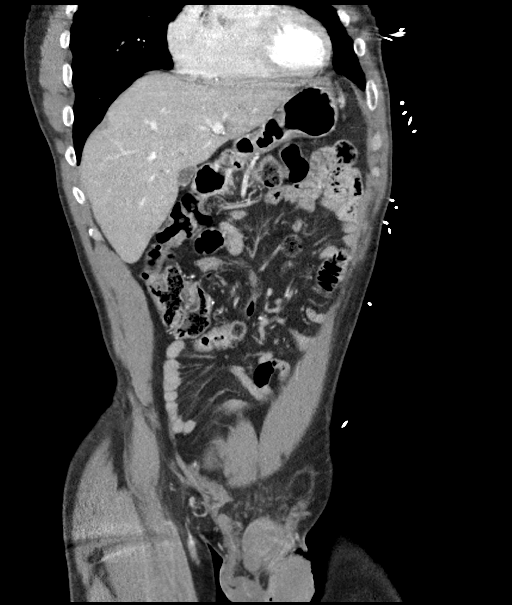
[im 52/117  soft-tissue]
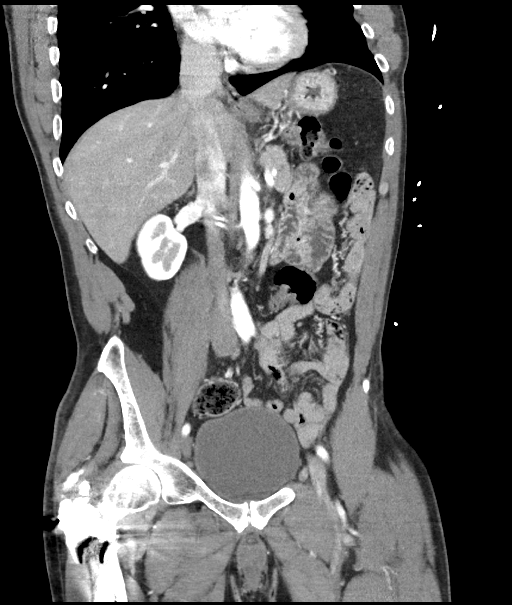
[im 65/117  soft-tissue]
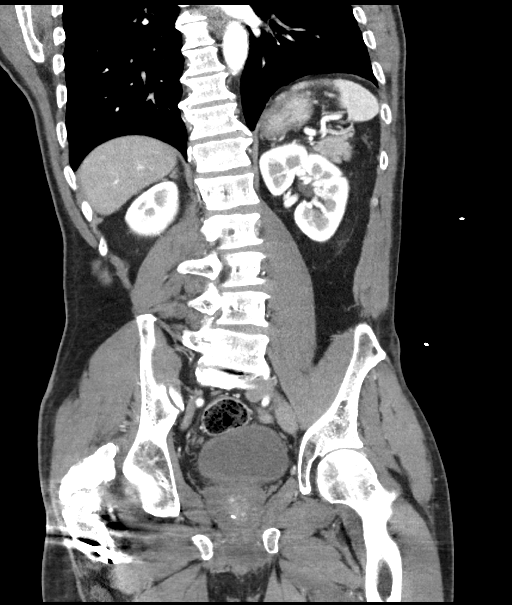

[16 of 46 positions shown; findings below may reference images not displayed]

RADIATION DOSE REDUCTION: This exam was performed according to the
departmental dose-optimization program which includes automated
exposure control, adjustment of the mA and/or kV according to
patient size and/or use of iterative reconstruction technique.

CONTRAST:  100mL OMNIPAQUE IOHEXOL 300 MG/ML  SOLN
FINDINGS: Lower chest: No acute abnormality.

Hepatobiliary: No focal liver abnormality is seen. No gallstones,
gallbladder wall thickening, or biliary dilatation.

Pancreas: Unremarkable

Spleen: Unremarkable

Adrenals/Urinary Tract: Adrenal glands are unremarkable. Kidneys are
normal, without renal calculi, focal lesion, or hydronephrosis.
Bladder is unremarkable.

Stomach/Bowel: The sigmoid colon is redundant. The stomach, small
bowel, and large bowel, however, are otherwise unremarkable. There
is no evidence of obstruction or focal inflammation. Appendectomy
has been performed. No free intraperitoneal gas or fluid.

Vascular/Lymphatic: No significant vascular findings are present. No
enlarged abdominal or pelvic lymph nodes.

Reproductive: Prostate is unremarkable.

Other: Small fat containing left inguinal hernia is noted. Rectum
unremarkable.

Musculoskeletal: Right femoral ORIF has been performed, partially
visualized. Bilateral L5 pars defects are present with stable
minimal (grade 1) anterolisthesis of L5 upon S1. Superimposed
degenerative changes are seen at the lumbosacral junction. No acute
bone abnormality.
IMPRESSION: No acute intra-abdominal pathology identified. No definite
radiographic explanation for the patient's reported symptoms.

Small left fat containing inguinal hernia.

## 2022-09-03 ENCOUNTER — Emergency Department (HOSPITAL_COMMUNITY): Payer: BC Managed Care – PPO

## 2022-09-03 ENCOUNTER — Other Ambulatory Visit: Payer: Self-pay

## 2022-09-03 ENCOUNTER — Emergency Department (HOSPITAL_COMMUNITY)
Admission: EM | Admit: 2022-09-03 | Discharge: 2022-09-03 | Disposition: A | Discharge status: Home / Self Care | Payer: BC Managed Care – PPO | Attending: Emergency Medicine | Admitting: Emergency Medicine

## 2022-09-03 DIAGNOSIS — R911 Solitary pulmonary nodule: Secondary | ICD-10-CM | POA: Diagnosis not present

## 2022-09-03 DIAGNOSIS — I10 Essential (primary) hypertension: Secondary | ICD-10-CM | POA: Diagnosis not present

## 2022-09-03 DIAGNOSIS — R079 Chest pain, unspecified: Secondary | ICD-10-CM

## 2022-09-03 DIAGNOSIS — R7989 Other specified abnormal findings of blood chemistry: Secondary | ICD-10-CM | POA: Diagnosis not present

## 2022-09-03 DIAGNOSIS — R001 Bradycardia, unspecified: Secondary | ICD-10-CM | POA: Diagnosis not present

## 2022-09-03 DIAGNOSIS — R0789 Other chest pain: Secondary | ICD-10-CM | POA: Diagnosis not present

## 2022-09-03 LAB — BASIC METABOLIC PANEL
Anion gap: 8 (ref 5–15)
BUN: 12 mg/dL (ref 8–23)
CO2: 23 mmol/L (ref 22–32)
Calcium: 9.1 mg/dL (ref 8.9–10.3)
Chloride: 107 mmol/L (ref 98–111)
Creatinine, Ser: 0.96 mg/dL (ref 0.61–1.24)
GFR, Estimated: >60 mL/min (ref >60)
Glucose, Bld: 109 mg/dL — ABNORMAL HIGH (ref 70–99)
Potassium: 3.7 mmol/L (ref 3.5–5.1)
Sodium: 138 mmol/L (ref 135–145)

## 2022-09-03 LAB — CBC
HCT: 35.3 % — ABNORMAL LOW (ref 39.0–52.0)
Hemoglobin: 11.7 g/dL — ABNORMAL LOW (ref 13.0–17.0)
MCH: 30.4 pg (ref 26.0–34.0)
MCHC: 33.1 g/dL (ref 30.0–36.0)
MCV: 91.7 fL (ref 80.0–100.0)
Platelets: 249 10*3/uL (ref 150–400)
RBC: 3.85 MIL/uL — ABNORMAL LOW (ref 4.22–5.81)
RDW: 12.9 % (ref 11.5–15.5)
WBC: 5.3 10*3/uL (ref 4.0–10.5)
nRBC: 0 % (ref 0.0–0.2)

## 2022-09-03 LAB — TROPONIN I (HIGH SENSITIVITY)
Troponin I (High Sensitivity): 3 ng/L (ref <18)
Troponin I (High Sensitivity): 3 ng/L (ref <18)

## 2022-09-03 LAB — D-DIMER, QUANTITATIVE: D-Dimer, Quant: 0.68 ug/mL-FEU — ABNORMAL HIGH (ref 0.00–0.50)

## 2022-09-03 MED ORDER — IOHEXOL 350 MG/ML SOLN
75.0000 mL | Freq: Once | INTRAVENOUS | Status: AC | PRN
  Administered 2022-09-03: 75 mL via INTRAVENOUS

## 2022-09-03 MED ORDER — ALUM & MAG HYDROXIDE-SIMETH 200-200-20 MG/5ML PO SUSP
30.0000 mL | Freq: Once | ORAL | Status: AC
  Administered 2022-09-03: 30 mL via ORAL
  Filled 2022-09-03: qty 30

## 2022-09-03 MED ORDER — LIDOCAINE VISCOUS HCL 2 % MT SOLN
15.0000 mL | Freq: Once | OROMUCOSAL | Status: AC
  Administered 2022-09-03: 15 mL via ORAL
  Filled 2022-09-03: qty 15

## 2022-09-03 MED ORDER — AMLODIPINE BESYLATE 5 MG PO TABS
5.0000 mg | ORAL_TABLET | Freq: Once | ORAL | Status: AC
  Administered 2022-09-03: 5 mg via ORAL
  Filled 2022-09-03: qty 1

## 2022-09-03 MED ORDER — AMLODIPINE BESYLATE 5 MG PO TABS: 5.0000 mg | ORAL_TABLET | Freq: Every day | ORAL | 2 refills | Status: DC

## 2022-09-03 MED ORDER — FAMOTIDINE 20 MG PO TABS
20.0000 mg | ORAL_TABLET | Freq: Once | ORAL | Status: AC
  Administered 2022-09-03: 20 mg via ORAL
  Filled 2022-09-03: qty 1

## 2022-09-03 NOTE — ED Provider Notes (Signed)
[place] EMERGENCY DEPARTMENT AT [facility] Provider Note   CSN: [id number] Arrival date & time: 09/03/22  1515     History {Add pertinent medical, surgical, social history, OB history to HPI:1} Chief Complaint  Patient presents with   Chest Pain    Tyler Chang is a 62 y.o. male.  62 year old male with a history of asthma who presents emergency department with chest pain.  Says that last night he started experiencing sharp pain in the left side of his chest.  Says that since then he has occasionally felt lightheaded and weak.  Says that it is worse with exertion.  Not pleuritic or positional.  No shortness of breath or cough.  Says that today it has subsided to more of a burning sensation.  Occasionally will get a foul taste in his mouth.  No personal or family history of MI.  No personal history of cancer or DVT or PE.  No leg swelling recently.  Not on any blood thinners.  Does not smoke.       Home Medications Prior to Admission medications   Medication Sig Start Date End Date Taking? Authorizing Provider  amLODipine (NORVASC) 5 MG tablet Take 1 tablet (5 mg total) by mouth daily. 09/03/22  Yes Rondel Baton, MD      Allergies    Patient has no known allergies.    Review of Systems   Review of Systems  Physical Exam Updated Vital Signs BP (!) 166/99 (BP Location: Right Arm)   Pulse 70   Temp 97.8 F (36.6 C) (Oral)   Resp 18   Ht 5\' 4"  (1.626 m)   Wt 61.2 kg   SpO2 99%   BMI 23.17 kg/m  Physical Exam Vitals and nursing note reviewed.  Constitutional:      General: He is not in acute distress.    Appearance: He is well-developed.  HENT:     Head: Normocephalic and atraumatic.     Right Ear: External ear normal.     Left Ear: External ear normal.     Nose: Nose normal.  Eyes:     Extraocular Movements: Extraocular movements intact.     Conjunctiva/sclera: Conjunctivae normal.     Pupils: Pupils are equal, round, and reactive to light.   Cardiovascular:     Rate and Rhythm: Regular rhythm. Bradycardia present.     Heart sounds: Normal heart sounds.     Comments: Chest pain not reproducible Pulmonary:     Effort: Pulmonary effort is normal. No respiratory distress.     Breath sounds: Normal breath sounds.  Abdominal:     General: There is no distension.     Palpations: Abdomen is soft. There is no mass.     Tenderness: There is no abdominal tenderness. There is no guarding.  Musculoskeletal:     Cervical back: Normal range of motion and neck supple.     Right lower leg: No edema.     Left lower leg: No edema.  Skin:    General: Skin is warm and dry.  Neurological:     Mental Status: He is alert. Mental status is at baseline.  Psychiatric:        Mood and Affect: Mood normal.        Behavior: Behavior normal.     ED Results / Procedures / Treatments   Labs (all labs ordered are listed, but only abnormal results are displayed) Labs Reviewed  BASIC METABOLIC PANEL - Abnormal; Notable  for the following components:      Result Value   Glucose, Bld 109 (*)    All other components within normal limits  CBC - Abnormal; Notable for the following components:   RBC 3.85 (*)    Hemoglobin 11.7 (*)    HCT 35.3 (*)    All other components within normal limits  D-DIMER, QUANTITATIVE  TROPONIN I (HIGH SENSITIVITY)  TROPONIN I (HIGH SENSITIVITY)    EKG EKG Interpretation Date/Time:  Friday September 03 2022 15:21:09 EDT Ventricular Rate:  71 PR Interval:  160 QRS Duration:  78 QT Interval:  370 QTC Calculation: 402 R Axis:   47  Text Interpretation: Normal sinus rhythm Normal ECG When compared with ECG of 26-Feb-2021 14:03, PREVIOUS ECG IS PRESENT Confirmed by Vonita Moss [phone]) on 09/03/2022 6:11:56 PM  Radiology DG Chest 2 View  Result Date: 09/03/2022 CLINICAL DATA:  Chest pain EXAM: CHEST - 2 VIEW COMPARISON:  X-ray 02/26/2021 FINDINGS: Eventration of the right hemidiaphragm. No consolidation,  pneumothorax or effusion. No edema. Scattered degenerative changes along the spine. Air-fluid level along the stomach beneath the left hemidiaphragm. IMPRESSION: No acute cardiopulmonary disease. Electronically Signed   By: Karen Kays M.D.   On: 09/03/2022 16:14    Procedures Procedures  {Document cardiac monitor, telemetry assessment procedure when appropriate:1}  Medications Ordered in ED Medications  alum & mag hydroxide-simeth (MAALOX/MYLANTA) 200-200-20 MG/5ML suspension 30 mL (has no administration in time range)    And  lidocaine (XYLOCAINE) 2 % viscous mouth solution 15 mL (has no administration in time range)  famotidine (PEPCID) tablet 20 mg (has no administration in time range)  amLODipine (NORVASC) tablet 5 mg (has no administration in time range)    ED Course/ Medical Decision Making/ A&P   {   Click here for ABCD2, HEART and other calculatorsREFRESH Note before signing :1}                              Medical Decision Making Amount and/or Complexity of Data Reviewed Labs: ordered. Radiology: ordered.  Risk OTC drugs. Prescription drug management.   ***  {Document critical care time when appropriate:1} {Document review of labs and clinical decision tools ie heart score, Chads2Vasc2 etc:1}  {Document your independent review of radiology images, and any outside records:1} {Document your discussion with family members, caretakers, and with consultants:1} {Document social determinants of health affecting pt's care:1} {Document your decision making why or why not admission, treatments were needed:1} Final Clinical Impression(s) / ED Diagnoses Final diagnoses:  None    Rx / DC Orders ED Discharge Orders          Ordered    amLODipine (NORVASC) 5 MG tablet  Daily        09/03/22 1812

## 2022-09-03 NOTE — ED Triage Notes (Signed)
Pt with left chest tightness, started yesterday.  Today with lightheadedness, denies any N/V.

## 2022-09-03 NOTE — Discharge Instructions (Signed)
You were seen for your chest pain in the emergency department.   At home, please take the amlodipine for your blood pressure.    Follow-up with your primary doctor in 2-3 days regarding your visit.  There was a lung nodule seen on your CT scan so please talk to them about follow-up for this.  Cardiology will be calling you regarding an appointment within the next 72 hours.  You may contact them if you do not hear from them in that time using the information in this packet.  Return immediately to the emergency department if you experience any of the following: Worsening pain, difficulty breathing, unexplained vomiting or sweating, or any other concerning symptoms.    Thank you for visiting our Emergency Department. It was a pleasure taking care of you today.

## 2022-09-03 NOTE — ED Provider Triage Note (Signed)
Emergency Medicine Provider Triage Evaluation Note  JURON PETITTE , a 62 y.o. male  was evaluated in triage.  Pt complains of left-sided chest pain that has been intermittent since yesterday, no active chest pain.  Denies chronic medical problems, denies shortness of breath or pleurisy.  Review of Systems  Positive: Chest pain Negative: Shortness of breath,  Physical Exam  BP (!) 166/99 (BP Location: Right Arm)   Pulse 70   Temp 97.8 F (36.6 C) (Oral)   Resp 18   Ht 5\' 4"  (1.626 m)   Wt 61.2 kg   SpO2 99%   BMI 23.17 kg/m  Gen:   Awake, no distress   Resp:  Normal effort  MSK:   Moves extremities without difficulty  Other:    Medical Decision Making  Medically screening exam initiated at 4:17 PM.  Appropriate orders placed.  ELIBERTO STEURER was informed that the remainder of the evaluation will be completed by another provider, this initial triage assessment does not replace that evaluation, and the importance of remaining in the ED until their evaluation is complete.     Carmel Sacramento A, [place] 09/03/22 1622

## 2022-09-30 ENCOUNTER — Encounter: Payer: Self-pay | Admitting: Internal Medicine

## 2022-09-30 ENCOUNTER — Other Ambulatory Visit (HOSPITAL_COMMUNITY)
Admission: RE | Admit: 2022-09-30 | Discharge: 2022-09-30 | Disposition: A | Discharge status: Home / Self Care | Payer: BC Managed Care – PPO | Source: Ambulatory Visit | Attending: Internal Medicine | Admitting: Internal Medicine

## 2022-09-30 ENCOUNTER — Ambulatory Visit: Payer: BC Managed Care – PPO | Attending: Internal Medicine | Admitting: Internal Medicine

## 2022-09-30 VITALS — BP 164/70 | HR 76 | Ht 64.0 in | Wt 154.2 lb

## 2022-09-30 DIAGNOSIS — R079 Chest pain, unspecified: Secondary | ICD-10-CM | POA: Diagnosis not present

## 2022-09-30 DIAGNOSIS — Z131 Encounter for screening for diabetes mellitus: Secondary | ICD-10-CM | POA: Insufficient documentation

## 2022-09-30 DIAGNOSIS — Z13220 Encounter for screening for lipoid disorders: Secondary | ICD-10-CM

## 2022-09-30 LAB — HEMOGLOBIN A1C
Hgb A1c MFr Bld: 6.1 % — ABNORMAL HIGH (ref 4.8–5.6)
Mean Plasma Glucose: 128.37 mg/dL

## 2022-09-30 NOTE — Patient Instructions (Signed)
Medication Instructions:   Restart Norvasc 5 mg Daily   *If you need a refill on your cardiac medications before your next appointment, please call your pharmacy*   Lab Work: Your physician recommends that you return for lab work in: Today   If you have labs (blood work) drawn today and your tests are completely normal, you will receive your results only by: MyChart Message (if you have MyChart) OR A paper copy in the mail If you have any lab test that is abnormal or we need to change your treatment, we will call you to review the results.   Testing/Procedures: NONE    Follow-Up: At [facility], you and your health needs are our priority.  As part of our continuing mission to provide you with exceptional heart care, we have created designated Provider Care Teams.  These Care Teams include your primary Cardiologist (physician) and Advanced Practice Providers (APPs -  Physician Assistants and Nurse Practitioners) who all work together to provide you with the care you need, when you need it.  We recommend signing up for the patient portal called "MyChart".  Sign up information is provided on this After Visit Summary.  MyChart is used to connect with patients for Virtual Visits (Telemedicine).  Patients are able to view lab/test results, encounter notes, upcoming appointments, etc.  Non-urgent messages can be sent to your provider as well.   To learn more about what you can do with MyChart, go to [email or web address].    Your next appointment:   1 month(s)  Provider:   You may see Dietrich Pates, MD or one of the following Advanced Practice Providers on your designated Care Team:   Randall An, PA-C  Jacolyn Reedy, PA-C     Other Instructions Thank you for choosing [facility]!

## 2022-09-30 NOTE — Progress Notes (Signed)
Cardiology Office Note   Date:  09/30/2022   ID:  Chang, Tyler 1960/02/16, MRN [id number]  PCP:  Benita Stabile, MD  Cardiologist:   Dietrich Pates, MD   Pt referred from ER for evaluation of CP      History of Present Illness: Tyler Chang is a 62 y.o. male with a history of asthma and HTN  Seen in ER on 09/03/22 for CP  Pain sharp, left sided.  Worse with exertion.  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     Allergies:   Patient has no known allergies.   Past Medical History:  Diagnosis Date   Asthma     Past Surgical History:  Procedure Laterality Date   LAPAROSCOPIC APPENDECTOMY N/A 12/27/2018   Procedure: APPENDECTOMY LAPAROSCOPIC;  Surgeon: Franky Macho, MD;  Location: [facility];  Service: General;  Laterality: N/A;   ORTHOPEDIC SURGERY Right    right knee and hip     Social History:  The patient  reports that he has never smoked. He has never used smokeless tobacco. He reports that he does not drink alcohol and does not use drugs.   Family History:  The patient's family history includes Cancer in his father and sister; Dementia in his brother.    ROS:  Please see the history of present illness. All other systems are reviewed and  Negative to the above problem except as noted.    PHYSICAL EXAM: VS:  BP (!) 164/70 (BP Location: Left Arm, Patient Position: Sitting, Cuff Size: Normal) Comment: Has not picked up medication  Pulse 76   Ht 5\' 4"  (1.626 m)   Wt 154 lb 3.2 oz (69.9 kg)   SpO2 97%   BMI 26.47 kg/m   BP on my check 164/80    GEN: Well nourished, well developed, in no  acute distress  HEENT: normal  Neck: no JVD, carotid bruits Cardiac: RRR; I/VI systolic murmur at apex   No LE  edema  Respiratory:  clear to auscultation bilaterally, GI: soft, nontender, nondistended, + BS  No hepatomegaly  MS: no deformity Moving all extremities   Skin: warm and dry, no rash Neuro:  Strength and sensation are intact Psych: euthymic mood, full affect   EKG:  EKG is not ordered today.   Lipid Panel    Component Value Date/Time   CHOL 183 05/06/2009 0000   TRIG 125 05/06/2009 0000   HDL 41 05/06/2009 0000   CHOLHDL 4.5 Ratio 05/06/2009 0000   VLDL 25 05/06/2009 0000   LDLCALC 117 (H) 05/06/2009 0000      Wt Readings from Last 3 Encounters:  09/30/22 154 lb 3.2 oz (69.9 kg)  09/03/22 135 lb (61.2 kg)  03/03/22 140 lb (63.5 kg)      ASSESSMENT AND PLAN:  1  Chest pain   Pain on L chest   Initailly concerning but he has not had any episodes since seen in ED  He remains active with work at shop     Will follow   No testing for now  2 HTN  BP is high  He did not start amlodpine  I have asked him to start   Follow up BP in 2 wks  Will get labs (lipomed, Lpa, apoB , A1C, TSH)     Current medicines are reviewed at length with the patient today.  The patient does not have concerns regarding medicines.  Signed, Dietrich Pates, MD  09/30/2022 2:17 PM    [facility] [address] [place], [place], [place]  [address] Phone: [phone]; Fax: [phone]

## 2022-10-01 LAB — MISC LABCORP TEST (SEND OUT): Labcorp test code: 167015

## 2022-10-01 LAB — NMR, LIPOPROFILE
Cholesterol, Total: 172 mg/dL (ref 100–199)
HDL Cholesterol by NMR: 57 mg/dL (ref >39)
HDL Particle Number: 37.3 umol/L (ref >=30.5)
LDL Particle Number: 876 nmol/L (ref <1000)
LDL Size: 20.9 nm (ref >20.5)
LDL-C (NIH Calc): 90 mg/dL (ref 0–99)
LP-IR Score: 49 — ABNORMAL HIGH (ref <=45)
Small LDL Particle Number: 466 nmol/L (ref <=527)
Triglycerides by NMR: 141 mg/dL (ref 0–149)

## 2022-10-02 LAB — LIPOPROTEIN A (LPA): Lipoprotein (a): 49 nmol/L — ABNORMAL HIGH (ref <75.0)

## 2022-10-04 ENCOUNTER — Other Ambulatory Visit: Payer: Self-pay

## 2022-10-04 MED ORDER — AMLODIPINE BESYLATE 5 MG PO TABS: 5.0000 mg | ORAL_TABLET | Freq: Every day | ORAL | 6 refills | Status: DC

## 2022-10-04 NOTE — Telephone Encounter (Signed)
Refilled amlodipine 5 mg every day to [facility],pt wants 30 day supply.

## 2022-10-12 DIAGNOSIS — J45909 Unspecified asthma, uncomplicated: Secondary | ICD-10-CM | POA: Diagnosis not present

## 2022-10-12 DIAGNOSIS — E782 Mixed hyperlipidemia: Secondary | ICD-10-CM | POA: Diagnosis not present

## 2022-10-12 DIAGNOSIS — I10 Essential (primary) hypertension: Secondary | ICD-10-CM | POA: Diagnosis not present

## 2022-10-12 DIAGNOSIS — R0789 Other chest pain: Secondary | ICD-10-CM | POA: Diagnosis not present

## 2022-10-12 DIAGNOSIS — Z7689 Persons encountering health services in other specified circumstances: Secondary | ICD-10-CM | POA: Diagnosis not present

## 2022-11-10 DIAGNOSIS — Z125 Encounter for screening for malignant neoplasm of prostate: Secondary | ICD-10-CM | POA: Diagnosis not present

## 2022-11-10 DIAGNOSIS — E782 Mixed hyperlipidemia: Secondary | ICD-10-CM | POA: Diagnosis not present

## 2022-11-10 DIAGNOSIS — R7303 Prediabetes: Secondary | ICD-10-CM | POA: Diagnosis not present

## 2022-11-11 ENCOUNTER — Ambulatory Visit: Payer: BC Managed Care – PPO | Admitting: Internal Medicine

## 2022-11-16 DIAGNOSIS — J45909 Unspecified asthma, uncomplicated: Secondary | ICD-10-CM | POA: Diagnosis not present

## 2022-11-16 DIAGNOSIS — Z0001 Encounter for general adult medical examination with abnormal findings: Secondary | ICD-10-CM | POA: Diagnosis not present

## 2022-11-16 DIAGNOSIS — R911 Solitary pulmonary nodule: Secondary | ICD-10-CM | POA: Diagnosis not present

## 2022-11-16 DIAGNOSIS — R0789 Other chest pain: Secondary | ICD-10-CM | POA: Diagnosis not present

## 2022-11-16 DIAGNOSIS — F419 Anxiety disorder, unspecified: Secondary | ICD-10-CM | POA: Diagnosis not present

## 2022-11-16 DIAGNOSIS — I10 Essential (primary) hypertension: Secondary | ICD-10-CM | POA: Diagnosis not present

## 2022-11-22 NOTE — Progress Notes (Unsigned)
Cardiology Office Note   Date:  11/24/2022   ID:  Tyler Chang, Tyler Chang 04/19/1960, MRN [id number]  PCP:  Benita Stabile, MD  Cardiologist:   Dietrich Pates, MD   Pt referred from ER for evaluation of CP      History of Present Illness: Tyler Chang is a 61 y.o. male with a history of asthma and HTN  Seen in ER on 09/03/22 for CP  Pain sharp, left sided.  Worse with exertion.  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   busPIRone (BUSPAR) 5 MG tablet Take 5 mg by mouth daily.     Allergies:   Patient has no known allergies.   Past Medical History:  Diagnosis Date   Asthma     Past Surgical History:  Procedure Laterality Date   LAPAROSCOPIC APPENDECTOMY N/A 12/27/2018   Procedure: APPENDECTOMY LAPAROSCOPIC;  Surgeon: Franky Macho, MD;  Location: [facility];  Service: General;  Laterality: N/A;   ORTHOPEDIC SURGERY Right    right knee and hip     Social History:  The patient  reports that he has never smoked. He has never used smokeless tobacco. He reports that he does not drink alcohol and does not use drugs.   Family History:  The patient's family history includes Cancer in his father and sister; Dementia in his brother.    ROS:  Please see the history of present illness. All other systems are reviewed and  Negative to the above problem except as noted.    PHYSICAL EXAM: VS:  BP 128/86 (BP Location: Right Arm, Patient Position: Sitting, Cuff Size: Normal)   Pulse 61   Ht 5\' 4"  (1.626 m)   Wt 149 lb 3.2 oz (67.7  kg)   SpO2 98%   BMI 25.61 kg/m    GEN: Thin 62 yo in no acute distress  HEENT: normal  Neck: no JVD, carotid bruit Cardiac: RRR; No LE  edema  Respiratory:  clear to auscultation GI: soft, nontender, No hepatomegaly  MS: no deformity Moving all extremities    EKG:  EKG is not ordered today.   Lipid Panel    Component Value Date/Time   CHOL 183 05/06/2009 0000   TRIG 141 09/30/2022 1458   HDL 57 09/30/2022 1458   CHOLHDL 4.5 Ratio 05/06/2009 0000   VLDL 25 05/06/2009 0000   LDLCALC 117 (H) 05/06/2009 0000      Wt Readings from Last 3 Encounters:  11/24/22 149 lb 3.2 oz (67.7 kg)  09/30/22 154 lb 3.2 oz (69.9 kg)  09/03/22 135 lb (61.2 kg)      ASSESSMENT AND PLAN:  1  Chest pain   No further episodes   2 HTN  BP controlled   Keep on amlodipine  3  Lipids   LDL 109  HDL 54  Trig 67  4  Metabolics  A1C 5.9   Reviewed diet   Cut out SSB   Minimize processed foods    Cut out bologna  Follow up end of August 2025  Stay active   Current medicines are reviewed at length with the patient today.  The patient does not have concerns regarding medicines.  Signed, Dietrich Pates, MD  11/24/2022 9:43 AM    [facility] [address] [place], [place], [place]  [address] Phone: [phone]; Fax: [phone]

## 2022-11-24 ENCOUNTER — Ambulatory Visit: Payer: BC Managed Care – PPO | Attending: Internal Medicine | Admitting: Internal Medicine

## 2022-11-24 ENCOUNTER — Encounter: Payer: Self-pay | Admitting: Internal Medicine

## 2022-11-24 VITALS — BP 128/86 | HR 61 | Ht 64.0 in | Wt 149.2 lb

## 2022-11-24 DIAGNOSIS — I10 Essential (primary) hypertension: Secondary | ICD-10-CM

## 2022-11-24 NOTE — Patient Instructions (Signed)
Medication Instructions:  Your physician recommends that you continue on your current medications as directed. Please refer to the Current Medication list given to you today.  *If you need a refill on your cardiac medications before your next appointment, please call your pharmacy*   Lab Work: NONE   If you have labs (blood work) drawn today and your tests are completely normal, you will receive your results only by: MyChart Message (if you have MyChart) OR A paper copy in the mail If you have any lab test that is abnormal or we need to change your treatment, we will call you to review the results.   Testing/Procedures: NONE    Follow-Up: At [facility], you and your health needs are our priority.  As part of our continuing mission to provide you with exceptional heart care, we have created designated Provider Care Teams.  These Care Teams include your primary Cardiologist (physician) and Advanced Practice Providers (APPs -  Physician Assistants and Nurse Practitioners) who all work together to provide you with the care you need, when you need it.  We recommend signing up for the patient portal called "MyChart".  Sign up information is provided on this After Visit Summary.  MyChart is used to connect with patients for Virtual Visits (Telemedicine).  Patients are able to view lab/test results, encounter notes, upcoming appointments, etc.  Non-urgent messages can be sent to your provider as well.   To learn more about what you can do with MyChart, go to [email or web address].    Your next appointment:    August   Provider:   You may see Dietrich Pates, MD or one of the following Advanced Practice Providers on your designated Care Team:   Randall An, PA-C  Jacolyn Reedy, PA-C     Other Instructions Thank you for choosing [facility]!

## 2023-02-21 ENCOUNTER — Emergency Department (HOSPITAL_COMMUNITY)
Admission: EM | Admit: 2023-02-21 | Discharge: 2023-02-21 | Disposition: A | Discharge status: Home / Self Care | Payer: BC Managed Care – PPO | Attending: Emergency Medicine | Admitting: Emergency Medicine

## 2023-02-21 ENCOUNTER — Encounter (HOSPITAL_COMMUNITY): Payer: Self-pay | Admitting: Emergency Medicine

## 2023-02-21 ENCOUNTER — Other Ambulatory Visit: Payer: Self-pay

## 2023-02-21 DIAGNOSIS — R059 Cough, unspecified: Secondary | ICD-10-CM | POA: Diagnosis not present

## 2023-02-21 DIAGNOSIS — Z20822 Contact with and (suspected) exposure to covid-19: Secondary | ICD-10-CM | POA: Insufficient documentation

## 2023-02-21 DIAGNOSIS — J45909 Unspecified asthma, uncomplicated: Secondary | ICD-10-CM | POA: Diagnosis not present

## 2023-02-21 DIAGNOSIS — J101 Influenza due to other identified influenza virus with other respiratory manifestations: Secondary | ICD-10-CM | POA: Insufficient documentation

## 2023-02-21 LAB — RESP PANEL BY RT-PCR (RSV, FLU A&B, COVID)  RVPGX2
Influenza A by PCR: POSITIVE — AB
Influenza B by PCR: NEGATIVE
Resp Syncytial Virus by PCR: NEGATIVE
SARS Coronavirus 2 by RT PCR: NEGATIVE

## 2023-02-21 MED ORDER — OSELTAMIVIR PHOSPHATE 75 MG PO CAPS: 75.0000 mg | ORAL_CAPSULE | Freq: Two times a day (BID) | ORAL | 0 refills | Status: AC

## 2023-02-21 NOTE — ED Provider Notes (Signed)
[facility] Emergency Department Provider Note MRN:  [id number]  Arrival date & time: 02/21/23     Chief Complaint   Cough History of Present Illness   Tyler Chang is a 63 y.o. year-old male with a history of asthma presenting to the ED with chief complaint of cough.  Increased cough and sneezing and congestion over the past few days, wanted to be evaluated.  Some bodyaches.  No chest pain or shortness of breath, no abdominal pain, no nausea vomiting or diarrhea, no fever.  Review of Systems  A thorough review of systems was obtained and all systems are negative except as noted in the HPI and PMH.   Patient's Health History    Past Medical History:  Diagnosis Date   Asthma     Past Surgical History:  Procedure Laterality Date   LAPAROSCOPIC APPENDECTOMY N/A 12/27/2018   Procedure: APPENDECTOMY LAPAROSCOPIC;  Surgeon: Franky Macho, MD;  Location: [facility];  Service: General;  Laterality: N/A;   ORTHOPEDIC SURGERY Right    right knee and hip    Family History  Problem Relation Age of Onset   Cancer Father    Cancer Sister    Dementia Brother     Social History   Socioeconomic History   Marital status: Legally Separated    Spouse name: Not on file   Number of children: Not on file   Years of education: Not on file   Highest education level: Not on file  Occupational History   Not on file  Tobacco Use   Smoking status: Never   Smokeless tobacco: Never  Vaping Use   Vaping status: Never Used  Substance and Sexual Activity   Alcohol use: No    Comment: occasional   Drug use: No   Sexual activity: Yes  Other Topics Concern   Not on file  Social History Narrative   Not on file   Social Drivers of Health   Financial Resource Strain: Not on file  Food Insecurity: Not on file  Transportation Needs: Not on file  Physical Activity: Not on file  Stress: Not on file  Social Connections: Not on file  Intimate Partner Violence: Not on  file     Physical Exam   Vitals:   02/21/23 0020  BP: (!) 134/90  Resp: (!) 22  Temp: 98.1 F (36.7 C)  SpO2: 98%    CONSTITUTIONAL: Well-appearing, NAD NEURO/PSYCH:  Alert and oriented x 3, no focal deficits EYES:  eyes equal and reactive ENT/NECK:  no LAD, no JVD CARDIO: Regular rate, well-perfused, normal S1 and S2 PULM:  CTAB no wheezing or rhonchi GI/GU:  non-distended, non-tender MSK/SPINE:  No gross deformities, no edema SKIN:  no rash, atraumatic   *Additional and/or pertinent findings included in MDM below  Diagnostic and Interventional Summary    EKG Interpretation Date/Time:    Ventricular Rate:    PR Interval:    QRS Duration:    QT Interval:    QTC Calculation:   R Axis:      Text Interpretation:         Labs Reviewed  RESP PANEL BY RT-PCR (RSV, FLU A&B, COVID)  RVPGX2 - Abnormal; Notable for the following components:      Result Value   Influenza A by PCR POSITIVE (*)    All other components within normal limits    No orders to display    Medications - No data to display   Procedures  /  Critical Care Procedures  ED Course and Medical Decision Making  Initial Impression and Ddx Symptoms suggestive of mild URI.  Normal vitals, no fever, well-appearing resting comfortably no acute distress.  Past medical/surgical history that increases complexity of ED encounter: None  Interpretation of Diagnostics Flu positive  Patient Reassessment and Ultimate Disposition/Management     Discharge  Patient management required discussion with the following services or consulting groups:  None  Complexity of Problems Addressed Acute complicated illness or Injury  Additional Data Reviewed and Analyzed Further history obtained from: Further history from spouse/family member  Additional Factors Impacting ED Encounter Risk Prescriptions  Elmer Sow. Pilar Plate, MD [facility] Emergency Medicine [facility] [email or web address]  Final  Clinical Impressions(s) / ED Diagnoses     ICD-10-CM   1. Influenza A  J10.1       ED Discharge Orders          Ordered    oseltamivir (TAMIFLU) 75 MG capsule  Every 12 hours        02/21/23 0234             Discharge Instructions Discussed with and Provided to Patient:     Discharge Instructions      You were evaluated in the Emergency Department and after careful evaluation, we did not find any emergent condition requiring admission or further testing in the hospital.  Your exam/testing today is overall reassuring.  You tested positive for the flu.  Take the Tamiflu medication as prescribed to help you get better faster.  Use Tylenol or Motrin for discomfort, plenty fluids and rest.  Please return to the Emergency Department if you experience any worsening of your condition.   Thank you for allowing [place] to be a part of your care.       Sabas Sous, MD 02/21/23 [phone]

## 2023-02-21 NOTE — Discharge Instructions (Signed)
You were evaluated in the Emergency Department and after careful evaluation, we did not find any emergent condition requiring admission or further testing in the hospital.  Your exam/testing today is overall reassuring.  You tested positive for the flu.  Take the Tamiflu medication as prescribed to help you get better faster.  Use Tylenol or Motrin for discomfort, plenty fluids and rest.  Please return to the Emergency Department if you experience any worsening of your condition.   Thank you for allowing [place] to be a part of your care.

## 2023-02-21 NOTE — ED Triage Notes (Signed)
Pt with c/o Flu-like symptoms (nasal congestion, body aches, cough) since Friday. Pt has tried OTC medications without relief.

## 2023-07-11 ENCOUNTER — Encounter (HOSPITAL_COMMUNITY): Payer: Self-pay

## 2023-07-11 ENCOUNTER — Other Ambulatory Visit: Payer: Self-pay

## 2023-07-11 ENCOUNTER — Emergency Department (HOSPITAL_COMMUNITY)
Admission: EM | Admit: 2023-07-11 | Discharge: 2023-07-12 | Disposition: A | Discharge status: Home / Self Care | Attending: Emergency Medicine | Admitting: Emergency Medicine

## 2023-07-11 DIAGNOSIS — R079 Chest pain, unspecified: Secondary | ICD-10-CM | POA: Diagnosis not present

## 2023-07-11 DIAGNOSIS — Z79899 Other long term (current) drug therapy: Secondary | ICD-10-CM | POA: Diagnosis not present

## 2023-07-11 DIAGNOSIS — I10 Essential (primary) hypertension: Secondary | ICD-10-CM | POA: Diagnosis not present

## 2023-07-11 DIAGNOSIS — R0789 Other chest pain: Secondary | ICD-10-CM | POA: Diagnosis not present

## 2023-07-11 HISTORY — DX: Essential (primary) hypertension: I10

## 2023-07-11 LAB — CBC WITH DIFFERENTIAL/PLATELET
Abs Immature Granulocytes: 0.02 10*3/uL (ref 0.00–0.07)
Basophils Absolute: 0.1 10*3/uL (ref 0.0–0.1)
Basophils Relative: 1 %
Eosinophils Absolute: 0.2 10*3/uL (ref 0.0–0.5)
Eosinophils Relative: 3 %
HCT: 42.2 % (ref 39.0–52.0)
Hemoglobin: 14 g/dL (ref 13.0–17.0)
Immature Granulocytes: 0 %
Lymphocytes Relative: 39 %
Lymphs Abs: 2.8 10*3/uL (ref 0.7–4.0)
MCH: 30.7 pg (ref 26.0–34.0)
MCHC: 33.2 g/dL (ref 30.0–36.0)
MCV: 92.5 fL (ref 80.0–100.0)
Monocytes Absolute: 0.6 10*3/uL (ref 0.1–1.0)
Monocytes Relative: 8 %
Neutro Abs: 3.5 10*3/uL (ref 1.7–7.7)
Neutrophils Relative %: 49 %
Platelets: 278 10*3/uL (ref 150–400)
RBC: 4.56 MIL/uL (ref 4.22–5.81)
RDW: 13.3 % (ref 11.5–15.5)
WBC: 7.2 10*3/uL (ref 4.0–10.5)
nRBC: 0 % (ref 0.0–0.2)

## 2023-07-11 LAB — COMPREHENSIVE METABOLIC PANEL WITH GFR
ALT: 30 U/L (ref 0–44)
AST: 30 U/L (ref 15–41)
Albumin: 4.2 g/dL (ref 3.5–5.0)
Alkaline Phosphatase: 93 U/L (ref 38–126)
Anion gap: 8 (ref 5–15)
BUN: 11 mg/dL (ref 8–23)
CO2: 25 mmol/L (ref 22–32)
Calcium: 9.6 mg/dL (ref 8.9–10.3)
Chloride: 104 mmol/L (ref 98–111)
Creatinine, Ser: 0.91 mg/dL (ref 0.61–1.24)
GFR, Estimated: >60 mL/min (ref >60)
Glucose, Bld: 92 mg/dL (ref 70–99)
Potassium: 4.4 mmol/L (ref 3.5–5.1)
Sodium: 137 mmol/L (ref 135–145)
Total Bilirubin: 0.3 mg/dL (ref 0.0–1.2)
Total Protein: 7.7 g/dL (ref 6.5–8.1)

## 2023-07-11 LAB — TROPONIN I (HIGH SENSITIVITY)
Troponin I (High Sensitivity): 4 ng/L (ref <18)
Troponin I (High Sensitivity): 3 ng/L (ref <18)

## 2023-07-11 MED ORDER — ACETAMINOPHEN 500 MG PO TABS
1000.0000 mg | ORAL_TABLET | Freq: Once | ORAL | Status: AC
  Administered 2023-07-11: 1000 mg via ORAL
  Filled 2023-07-11: qty 2

## 2023-07-11 NOTE — ED Provider Notes (Signed)
 [place] EMERGENCY DEPARTMENT AT [facility] Provider Note   CSN: [id number] Arrival date & time: 07/11/23  1620     Patient presents with: Hypertension   Tyler Chang is a 63 y.o. male.  He has history of hypertension.  He presents the ER today for elevated blood pressure at home.  He states when he was up this morning he had a mild headache and was experiencing some intermittent right upper chest pain so he took his blood pressure.  He noted it was elevated proximately 160 systolic.  As he had not taken his blood pressure medications in a long time because his blood pressure had been normal so he stopped.  He did take his dose of amlodipine  today.  He states his chest pain is mostly gone but still occasionally feels it.  He also had improvement in his headache but is not fully resolved yet.  He had no numbness tingling or weakness, no blurry vision, no vomiting, dizziness or diaphoresis.  Chest pain is not radiating, it is not worse with exertion.  He has no shortness of breath or pleurisy with this.  {Add pertinent medical, surgical, social history, OB history to HPI:32947}  Hypertension       Prior to Admission medications   Medication Sig Start Date End Date Taking? Authorizing Provider  amLODipine  (NORVASC ) 5 MG tablet Take 1 tablet (5 mg total) by mouth daily. 10/04/22   Elmyra Haggard, MD  busPIRone (BUSPAR) 5 MG tablet Take 5 mg by mouth daily.    [provider]    Allergies: Patient has no known allergies.    Review of Systems  Updated Vital Signs BP (!) 152/98   Pulse (!) 57   Temp 98.6 F (37 C) (Oral)   Resp 17   Ht 5' 4 (1.626 m)   Wt 63.5 kg   SpO2 99%   BMI 24.03 kg/m   Physical Exam Vitals and nursing note reviewed.  Constitutional:      General: He is not in acute distress.    Appearance: He is well-developed.  HENT:     Head: Normocephalic and atraumatic.     Mouth/Throat:     Mouth: Mucous membranes are moist.   Eyes:      Conjunctiva/sclera: Conjunctivae normal.    Cardiovascular:     Rate and Rhythm: Normal rate and regular rhythm.     Heart sounds: No murmur heard. Pulmonary:     Effort: Pulmonary effort is normal. No respiratory distress.     Breath sounds: Normal breath sounds.  Abdominal:     Palpations: Abdomen is soft.     Tenderness: There is no abdominal tenderness.   Musculoskeletal:        General: No swelling.     Cervical back: Neck supple.   Skin:    General: Skin is warm and dry.     Capillary Refill: Capillary refill takes less than 2 seconds.   Neurological:     General: No focal deficit present.     Mental Status: He is alert and oriented to person, place, and time.   Psychiatric:        Mood and Affect: Mood normal.     (all labs ordered are listed, but only abnormal results are displayed) Labs Reviewed  CBC WITH DIFFERENTIAL/PLATELET  COMPREHENSIVE METABOLIC PANEL WITH GFR  TROPONIN I (HIGH SENSITIVITY)  TROPONIN I (HIGH SENSITIVITY)    EKG: None  Radiology: No results found.  {Document cardiac  monitor, telemetry assessment procedure when appropriate:32947} Procedures   Medications Ordered in the ED  acetaminophen  (TYLENOL ) tablet 1,000 mg (has no administration in time range)      {Click here for ABCD2, HEART and other calculators REFRESH Note before signing:1}                              Medical Decision Making This patient presents to the ED for concern of pressure and right-sided chest pain, this involves an extensive number of treatment options, and is a complaint that carries with it a high risk of complications and morbidity.  The differential diagnosis includes uncontrolled hypertension, ACS, PE, pneumonia,   Co morbidities that complicate the patient evaluation :   ***   Additional history obtained:  Additional history obtained from *** External records from outside source obtained and reviewed including ***   Lab Tests:  I  Ordered, and personally interpreted labs.  The pertinent results include:  ***    Imaging Studies ordered:  I ordered imaging studies including *** which shows *** I independently visualized and interpreted imaging within scope of identifying emergent findings  I agree with the radiologist interpretation   Cardiac Monitoring: / EKG:  The patient was maintained on a cardiac monitor.  I personally viewed and interpreted the cardiac monitored which showed an underlying rhythm of: ***   Consultations Obtained:  I requested consultation with the ***,  and discussed lab and imaging findings as well as pertinent plan - they recommend: ***   Problem List / ED Course / Critical interventions / Medication management  *** I ordered medication including ***  for ***  Reevaluation of the patient after these medicines showed that the patient {resolved/improved/worsened:23923::improved} I have reviewed the patients home medicines and have made adjustments as needed   Social Determinants of Health:  ***   Test / Admission - Considered:  ***    Amount and/or Complexity of Data Reviewed Labs: ordered.  Risk OTC drugs.   ***  {Document critical care time when appropriate  Document review of labs and clinical decision tools ie CHADS2VASC2, etc  Document your independent review of radiology images and any outside records  Document your discussion with family members, caretakers and with consultants  Document social determinants of health affecting pt's care  Document your decision making why or why not admission, treatments were needed:32947:::1}   Final diagnoses:  None    ED Discharge Orders     None

## 2023-07-11 NOTE — ED Triage Notes (Signed)
 Pt arrived via POV c/o hypertension earlier today. Pt reports he has not been consistently taking his BP medications. Pt endorses mild chest pain on the right chest, Pt denies injury.

## 2023-07-12 ENCOUNTER — Emergency Department (HOSPITAL_COMMUNITY)

## 2023-07-12 DIAGNOSIS — R079 Chest pain, unspecified: Secondary | ICD-10-CM | POA: Diagnosis not present

## 2023-08-01 DIAGNOSIS — D649 Anemia, unspecified: Secondary | ICD-10-CM | POA: Diagnosis not present

## 2023-08-01 DIAGNOSIS — R7303 Prediabetes: Secondary | ICD-10-CM | POA: Diagnosis not present

## 2023-08-01 DIAGNOSIS — E782 Mixed hyperlipidemia: Secondary | ICD-10-CM | POA: Diagnosis not present

## 2023-08-05 DIAGNOSIS — E782 Mixed hyperlipidemia: Secondary | ICD-10-CM | POA: Diagnosis not present

## 2023-08-05 DIAGNOSIS — R7303 Prediabetes: Secondary | ICD-10-CM | POA: Diagnosis not present

## 2023-08-05 DIAGNOSIS — I10 Essential (primary) hypertension: Secondary | ICD-10-CM | POA: Diagnosis not present

## 2023-08-05 DIAGNOSIS — Z7689 Persons encountering health services in other specified circumstances: Secondary | ICD-10-CM | POA: Diagnosis not present

## 2023-08-05 DIAGNOSIS — R0789 Other chest pain: Secondary | ICD-10-CM | POA: Diagnosis not present

## 2023-08-08 ENCOUNTER — Other Ambulatory Visit (HOSPITAL_COMMUNITY): Payer: Self-pay | Admitting: Nurse Practitioner

## 2023-08-08 DIAGNOSIS — R911 Solitary pulmonary nodule: Secondary | ICD-10-CM

## 2023-08-14 ENCOUNTER — Ambulatory Visit (HOSPITAL_COMMUNITY): Admission: RE | Admit: 2023-08-14 | Source: Ambulatory Visit

## 2023-08-17 ENCOUNTER — Encounter: Payer: Self-pay | Admitting: Nurse Practitioner

## 2023-08-17 DIAGNOSIS — R911 Solitary pulmonary nodule: Secondary | ICD-10-CM

## 2023-08-21 ENCOUNTER — Ambulatory Visit (HOSPITAL_COMMUNITY)

## 2023-08-21 NOTE — Progress Notes (Unsigned)
 Cardiology Office Note    Date:  08/24/2023  ID:  Jakeob, Tullis December 22, 1960, MRN [id number] Cardiologist: Vina Gull, MD Cardiology APP:  Johnson Laymon HERO, PA-C { :  History of Present Illness:    Tyler Chang is a 63 y.o. male with past medical history of hypertension and asthma who presents to the office today for 61-month follow-up.  He was examined by Dr. Gull in 10/2022 and reported occasional shortness of breath if hot while working but denied any chest pain. He was consuming a lot of processed food and was encouraged to reduce his intake of sodium  He was continued on Amlodipine  5 mg daily.  He did present to [facility] ED on 07/11/2023 for evaluation of elevated blood pressure as his SBP had been in the 160's at times. He reported not taking Amlodipine  as his blood pressure had been normal when checked in the interim. He also reported episodes of right sided chest pain but pain would occur at rest and troponin values were negative with EKG showing no acute ST changes. He was encouraged to resume Amlodipine  and follow-up with Cardiology as an outpatient.  In talking with the patient today, he reports overall feeling well since recent ED evaluation. The patient and his wife have been checking his blood pressure at home and it has overall been well-controlled with no recurrent elevated readings. BP is soft at 100/62 on initial check today and at 106/62 on repeat check. He denies any associated lightheadedness, dizziness or presyncope. He remains physically active at his job and also push mows the yard.  Denies any recent chest pain or dyspnea on exertion with this. No recent orthopnea, PND or pitting edema.  Studies Reviewed:   EKG: EKG is not ordered today. EKG from 07/11/2023 is reviewed and shows sinus bradycardia, HR 49 with isolated TWI along Lead III.   CTA: 08/2022 FINDINGS: Cardiovascular: Thoracic aorta as visualized is within normal limits. The degree of  opacification is limited with regards to dissection evaluation. Heart is not significantly enlarged in size. The pulmonary artery shows a normal branching pattern bilaterally. No filling defect to suggest pulmonary embolism is noted.   Mediastinum/Nodes: Thoracic inlet is within normal limits. No hilar or mediastinal adenopathy is noted. The esophagus as visualized is within normal limits.   Lungs/Pleura: Lungs are well aerated bilaterally. There is a 5 mm nodule noted in the superior segment of the right lower lobe best seen on image number 52 of series 6. A few scattered smaller nodules are noted as well   Upper Abdomen: Visualized upper abdomen is within normal limits.   Musculoskeletal: No acute bony abnormality is noted.   Review of the MIP images confirms the above findings.   IMPRESSION: No evidence of pulmonary emboli.   Multiple pulmonary nodules. Most significant: Right solid pulmonary nodule measuring 5 mm. Per Fleischner Society Guidelines, no routine follow-up imaging is recommended.    Physical Exam:   VS:  BP 106/62   Pulse 60   Ht 5' 4 (1.626 m)   Wt 147 lb 6.4 oz (66.9 kg)   SpO2 97%   BMI 25.30 kg/m    Wt Readings from Last 3 Encounters:  08/24/23 147 lb 6.4 oz (66.9 kg)  07/11/23 139 lb 15.9 oz (63.5 kg)  02/21/23 140 lb (63.5 kg)     GEN: Well nourished, well developed male appearing in no acute distress NECK: No JVD; No carotid bruits CARDIAC: RRR, no murmurs, rubs, gallops  RESPIRATORY:  Clear to auscultation without rales, wheezing or rhonchi  ABDOMEN: Appears non-distended. No obvious abdominal masses. EXTREMITIES: No clubbing or cyanosis. No pitting edema.  Distal pedal pulses are 2+ bilaterally.   Assessment and Plan:   1. Essential hypertension - BP is well-controlled at 106/62 on recheck today. I encouraged him to reach out if SBP drops to less than 100 as we may need to reduce Amlodipine  to 2.5 mg daily. Will continue current  medication regimen with Amlodipine  5 mg daily at this time.  2. Pulmonary nodule - Prior CTA in 08/2022 showed multiple pulmonary nodules and one measuring up to 5 mm. Repeat imaging has been arranged by his PCP and is scheduled for 08/28/2023.  Signed, Laymon CHRISTELLA Qua, PA-C

## 2023-08-24 ENCOUNTER — Encounter: Payer: Self-pay | Admitting: Student

## 2023-08-24 ENCOUNTER — Ambulatory Visit: Attending: Student | Admitting: Student

## 2023-08-24 VITALS — BP 106/62 | HR 60 | Ht 64.0 in | Wt 147.4 lb

## 2023-08-24 DIAGNOSIS — R918 Other nonspecific abnormal finding of lung field: Secondary | ICD-10-CM | POA: Diagnosis not present

## 2023-08-24 DIAGNOSIS — I10 Essential (primary) hypertension: Secondary | ICD-10-CM | POA: Diagnosis not present

## 2023-08-24 MED ORDER — AMLODIPINE BESYLATE 5 MG PO TABS: 5.0000 mg | ORAL_TABLET | Freq: Every day | ORAL | 3 refills | Status: AC

## 2023-08-24 NOTE — Patient Instructions (Signed)
 Medication Instructions:   Continue current medication regimen. Let us  know if top blood pressure number (systolic) drops less than 100 as we may need to reduce Amlodipine .  *If you need a refill on your cardiac medications before your next appointment, please call your pharmacy*   Follow-Up: At [facility], you and your health needs are our priority.  As part of our continuing mission to provide you with exceptional heart care, our providers are all part of one team.  This team includes your primary Cardiologist (physician) and Advanced Practice Providers or APPs (Physician Assistants and Nurse Practitioners) who all work together to provide you with the care you need, when you need it.  Your next appointment:   6 month(s)  Provider:   You may see Vina Gull, MD or one of the following Advanced Practice Providers on your designated Care Team:   Laymon Qua, PA-C  [place], [place] Olivia Pavy, [place]     We recommend signing up for the patient portal called MyChart.  Sign up information is provided on this After Visit Summary.  MyChart is used to connect with patients for Virtual Visits (Telemedicine).  Patients are able to view lab/test results, encounter notes, upcoming appointments, etc.  Non-urgent messages can be sent to your provider as well.   To learn more about what you can do with MyChart, go to [email or web address].

## 2023-08-28 ENCOUNTER — Ambulatory Visit (HOSPITAL_COMMUNITY): Admission: RE | Admit: 2023-08-28 | Source: Ambulatory Visit

## 2023-08-28 ENCOUNTER — Ambulatory Visit (HOSPITAL_COMMUNITY)

## 2023-09-17 ENCOUNTER — Emergency Department (HOSPITAL_COMMUNITY)

## 2023-09-17 ENCOUNTER — Emergency Department (HOSPITAL_COMMUNITY)
Admission: EM | Admit: 2023-09-17 | Discharge: 2023-09-17 | Disposition: A | Discharge status: Home / Self Care | Attending: Student | Admitting: Student

## 2023-09-17 ENCOUNTER — Encounter (HOSPITAL_COMMUNITY): Payer: Self-pay | Admitting: Emergency Medicine

## 2023-09-17 DIAGNOSIS — J45909 Unspecified asthma, uncomplicated: Secondary | ICD-10-CM | POA: Insufficient documentation

## 2023-09-17 DIAGNOSIS — M1711 Unilateral primary osteoarthritis, right knee: Secondary | ICD-10-CM | POA: Diagnosis not present

## 2023-09-17 DIAGNOSIS — Z79899 Other long term (current) drug therapy: Secondary | ICD-10-CM | POA: Diagnosis not present

## 2023-09-17 DIAGNOSIS — I10 Essential (primary) hypertension: Secondary | ICD-10-CM | POA: Diagnosis not present

## 2023-09-17 DIAGNOSIS — M25561 Pain in right knee: Secondary | ICD-10-CM | POA: Diagnosis not present

## 2023-09-17 DIAGNOSIS — M85861 Other specified disorders of bone density and structure, right lower leg: Secondary | ICD-10-CM | POA: Diagnosis not present

## 2023-09-17 MED ORDER — PREDNISONE 20 MG PO TABS
40.0000 mg | ORAL_TABLET | Freq: Every day | ORAL | 0 refills | Status: AC
Start: 2023-09-17 — End: ?

## 2023-09-17 MED ORDER — NAPROXEN 500 MG PO TABS
500.0000 mg | ORAL_TABLET | Freq: Two times a day (BID) | ORAL | 0 refills | Status: AC
Start: 2023-09-17 — End: ?

## 2023-09-17 MED ORDER — HYDROCODONE-ACETAMINOPHEN 5-325 MG PO TABS
1.0000 | ORAL_TABLET | Freq: Once | ORAL | Status: AC
  Administered 2023-09-17: 1 via ORAL
  Filled 2023-09-17: qty 1

## 2023-09-17 NOTE — ED Provider Notes (Signed)
 [place] EMERGENCY DEPARTMENT AT [facility] Provider Note   CSN: [id number] Arrival date & time: 09/17/23  2053     Patient presents with: Knee Pain   Tyler Chang is a 63 y.o. male patient with past medical history of hypertension and asthma presents to the emergency room with complaint of right knee pain.  Patient reports when he was in the middle of walking his dog he started experiencing a pinching/stabbing right knee pain.  Locates to the medial aspect of his right knee.  It made him hard to bear weight and bend his knee.  He reports he is very active and is typically able to ambulate with steady gait.  He denies any specific injury or trauma to the leg.  Has not noticed any calf swelling or calf pain with this.  He has no history of DVT.  No associated fever or rash.    Knee Pain      Prior to Admission medications   Medication Sig Start Date End Date Taking? Authorizing Provider  amLODipine  (NORVASC ) 5 MG tablet Take 1 tablet (5 mg total) by mouth daily. 08/24/23   Strader, Laymon HERO, PA-C  busPIRone (BUSPAR) 5 MG tablet Take 5 mg by mouth daily.    [provider]    Allergies: Patient has no known allergies.    Review of Systems  Musculoskeletal:  Positive for arthralgias.    Updated Vital Signs BP (!) 137/90 (BP Location: Right Arm)   Pulse 64   Temp 98.4 F (36.9 C) (Oral)   Resp 18   SpO2 98%   Physical Exam Vitals and nursing note reviewed.  Constitutional:      General: He is not in acute distress.    Appearance: He is not toxic-appearing.  HENT:     Head: Normocephalic and atraumatic.  Eyes:     General: No scleral icterus.    Conjunctiva/sclera: Conjunctivae normal.  Cardiovascular:     Rate and Rhythm: Normal rate and regular rhythm.     Pulses: Normal pulses.     Heart sounds: Normal heart sounds.  Pulmonary:     Effort: Pulmonary effort is normal. No respiratory distress.     Breath sounds: Normal breath sounds.   Abdominal:     General: Abdomen is flat. Bowel sounds are normal.     Palpations: Abdomen is soft.     Tenderness: There is no abdominal tenderness.  Musculoskeletal:     Right lower leg: No edema.     Left lower leg: No edema.     Comments: Patient has pain with passive range of motion primarily on medial aspect of knee.  He is neurovascularly intact.  There is no overlying cellulitis.  There is no joint effusion.  Skin:    General: Skin is warm and dry.     Findings: No lesion.  Neurological:     General: No focal deficit present.     Mental Status: He is alert and oriented to person, place, and time. Mental status is at baseline.     (all labs ordered are listed, but only abnormal results are displayed) Labs Reviewed - No data to display  EKG: None  Radiology: DG Knee Complete 4 Views Right Result Date: 09/17/2023 CLINICAL DATA:  Sudden onset right knee pain, now unable to bend the knee. EXAM: RIGHT KNEE - COMPLETE 4+ VIEW COMPARISON:  AP Lat study of 11/10/2014 FINDINGS: Four views, lateral performed cross-table. The bones are mildly demineralized. There is  a partially visible intramedullary femoral rod again noted with 2 locking securing screws. There is no overt evidence of hardware loosening. 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 There is no evidence of fracture, dislocation or joint effusion. No other focal bone abnormality is seen. IMPRESSION: 1. Osteopenia and degenerative change without evidence of fracture or dislocation. 2. Partially visible intramedullary femoral rod with 2 locking securing screws. No overt hardware loosening. Electronically Signed   By: Francis Quam M.D.   On: 09/17/2023 22:18     Procedures   Medications Ordered in the ED  HYDROcodone -acetaminophen  (NORCO/VICODIN) 5-325 MG per tablet 1  tablet (has no administration in time range)                                    Medical Decision Making Amount and/or Complexity of Data Reviewed Radiology: ordered.  Risk Prescription drug management.   This patient presents to the ED for concern of right knee pain, this involves an extensive number of treatment options, and is a complaint that carries with it a high risk of complications and morbidity.  The differential diagnosis includes septic joint, DVT, peripheral artery disease, cellulitis, gout, arthritis, fracture, sprain   Imaging Studies ordered:  I ordered imaging studies including right knee x-ray I independently visualized and interpreted imaging which showed osteopenia and degeneration of the joint, no acute pathology, properly placed hardware. I agree with the radiologist interpretation   Problem List / ED Course / Critical interventions / Medication management  I have reviewed the patients home medicines and have made adjustments as needed. Vitals stable.  Patient presenting with right knee pain.  Hemodynamically stable and well-appearing.  He is neurovascularly intact.  He has pain with active range of motion and able to initiate flexion and extension.  Sensation is intact.  He has good passive range of motion.  His pain is primarily reproducible on the medial aspect of his joint.  He had no specific injury trauma or fall.  No plant and twist like injury.  He has no signs of joint effusion.  I very low suspicion for something like DVT or septic joint.  His x-ray shows no acute fracture.  He was given knee brace and pain medicine.  Feel appropriate for discharge with outpatient follow-up with orthopedics.      Final diagnoses:  Acute pain of right knee    ED Discharge Orders     None          Tyler Chang 09/17/23 2241    Albertina Dixon, MD 09/18/23 1511

## 2023-09-17 NOTE — ED Triage Notes (Addendum)
 Pt got up to walk dog and felt sharp pain in R knee. He cannot bend it now bc of pain. States hx of hardware from previous accident decades ago. No fevers. Still works at [employer] and active. No hx of gout

## 2023-09-17 NOTE — Discharge Instructions (Addendum)
 Take prednisone  for 5 days and alternate with Tylenol .  Once this is finished prednisone  you can start naproxen  and alternate with tylenol  for pain control.  Use knee brace.  Follow-up with orthopedics.  Return to ER with new or worsening symptoms.

## 2023-09-28 DIAGNOSIS — M25561 Pain in right knee: Secondary | ICD-10-CM | POA: Diagnosis not present

## 2023-09-28 DIAGNOSIS — Z7689 Persons encountering health services in other specified circumstances: Secondary | ICD-10-CM | POA: Diagnosis not present

## 2023-09-28 DIAGNOSIS — G8929 Other chronic pain: Secondary | ICD-10-CM | POA: Diagnosis not present

## 2024-01-17 ENCOUNTER — Other Ambulatory Visit: Payer: Self-pay | Admitting: Family Medicine

## 2024-01-17 DIAGNOSIS — R911 Solitary pulmonary nodule: Secondary | ICD-10-CM
